# Patient Record
Sex: Female | Born: 1960 | Race: White | Hispanic: No | Marital: Married | Smoking: Never smoker
Health system: Southern US, Community
[De-identification: ages and names within clinical notes are randomized; demographics above are authoritative.]

## PROBLEM LIST (undated history)

## (undated) DIAGNOSIS — I10 Essential (primary) hypertension: Secondary | ICD-10-CM

## (undated) DIAGNOSIS — C801 Malignant (primary) neoplasm, unspecified: Secondary | ICD-10-CM

## (undated) HISTORY — DX: Malignant (primary) neoplasm, unspecified: C80.1

## (undated) HISTORY — PX: ABDOMINAL HYSTERECTOMY: SHX81

---

## 1994-05-26 DIAGNOSIS — Z8742 Personal history of other diseases of the female genital tract: Secondary | ICD-10-CM | POA: Insufficient documentation

## 1998-04-10 ENCOUNTER — Other Ambulatory Visit: Admission: RE | Admit: 1998-04-10 | Discharge: 1998-04-10 | Payer: Self-pay | Admitting: Gynecology

## 1999-12-26 ENCOUNTER — Other Ambulatory Visit: Admission: RE | Admit: 1999-12-26 | Discharge: 1999-12-26 | Payer: Self-pay | Admitting: Gynecology

## 2001-01-07 ENCOUNTER — Other Ambulatory Visit: Admission: RE | Admit: 2001-01-07 | Discharge: 2001-01-07 | Payer: Self-pay | Admitting: Gynecology

## 2001-06-07 ENCOUNTER — Encounter (INDEPENDENT_AMBULATORY_CARE_PROVIDER_SITE_OTHER): Payer: Self-pay | Admitting: Specialist

## 2001-06-07 ENCOUNTER — Observation Stay (HOSPITAL_COMMUNITY): Admission: RE | Admit: 2001-06-07 | Discharge: 2001-06-08 | Payer: Self-pay | Admitting: Gynecology

## 2004-08-15 ENCOUNTER — Other Ambulatory Visit: Admission: RE | Admit: 2004-08-15 | Discharge: 2004-08-15 | Payer: Self-pay | Admitting: Gynecology

## 2008-05-31 ENCOUNTER — Ambulatory Visit: Payer: Self-pay

## 2008-05-31 ENCOUNTER — Ambulatory Visit: Payer: Self-pay | Admitting: Cardiology

## 2008-05-31 LAB — CONVERTED CEMR LAB
BUN: 13 mg/dL (ref 6–23)
CO2: 28 meq/L (ref 19–32)
Calcium: 8.9 mg/dL (ref 8.4–10.5)
Chloride: 105 meq/L (ref 96–112)
Creatinine, Ser: 0.6 mg/dL (ref 0.4–1.2)
Glucose, Bld: 89 mg/dL (ref 70–99)

## 2009-04-03 ENCOUNTER — Encounter: Admission: RE | Admit: 2009-04-03 | Discharge: 2009-04-03 | Payer: Self-pay | Admitting: Gynecology

## 2010-03-26 ENCOUNTER — Encounter: Admission: RE | Admit: 2010-03-26 | Discharge: 2010-03-26 | Payer: Self-pay | Admitting: Gynecology

## 2010-06-16 ENCOUNTER — Encounter: Payer: Self-pay | Admitting: Gynecology

## 2010-09-16 ENCOUNTER — Other Ambulatory Visit: Payer: Self-pay | Admitting: Gynecology

## 2010-10-08 NOTE — Assessment & Plan Note (Signed)
[facility]                            CARDIOLOGY OFFICE NOTE   NAME:Rhonda Roberson, Rhonda Roberson                       MRN:          [id number]  DATE:05/31/2008                            DOB:          [birth year]    Rhonda Roberson is a pleasant 50 year old female who I am asked to evaluate  for chest pain, dizziness, and hypertension.  She has no prior cardiac  history.  She typically does not have dyspnea on exertion, orthopnea,  PND, pedal edema, palpitations, or syncope.  Over the past 2 months, she  has noticed occasional chest pain.  It is in the substernal area.  It is  described as a pressure.  It lasted for 1-2 minutes and resolved  spontaneously.  She notices this more with activity and it does not  occur at rest.  Note, it is not pleuritic in positional nor it is  related to food.  There is no associated nausea, vomiting, shortness  breath, or diaphoresis.  She has also had episodes of dizziness.  She  states she will suddenly become lightheaded.  This lasted for  approximately one minute and resolved spontaneously.  It is not  associated with palpitations, chest pain, nausea, vomiting, dysarthria,  or loss of strength and sensation in her extremities.  She also has been  diagnosed with hypertension.  She states her blood pressure systolic  runs in the 180-190 range at home.  She does state that she occasionally  feels headaches as well with a warm sensation prior to this.  Because of  the above, we were asked to further evaluate.   PRESENT MEDICATION:  Micardis HCT 80/12.5 mg p.o. daily.   ALLERGIES:  She has no known drug allergies.   SOCIAL HISTORY:  She does not smoke.  She occasionally consumes alcohol.  She is married, with 2 children.   FAMILY HISTORY:  Negative for coronary artery disease.  There is also no  hypertension in her family.   PAST MEDICAL HISTORY:  Significant for hypertension, but there is no  diabetes mellitus or hyperlipidemia.   She has a history of a partial  hysterectomy.   REVIEW OF SYSTEMS:  She denies any fevers or chills.  She has had  problems with headaches.  There is no productive cough or hemoptysis.  There is no dysphagia, odynophagia, melena, or hematochezia.  There is  no dysuria or hematuria.  There is no rash or seizure activity.  There  is no orthopnea, PND, or pedal edema.  She does have some pain in her  knees at times.  The remaining systems are negative.   PHYSICAL EXAMINATION:  VITAL SIGNS:  Today shows a blood pressure of  148/90 in the left arm and 144/88 in the right arm.  Her pulse is 69.  She weighs 233 pounds.  GENERAL:  She is well-developed and somewhat obese.  She is in no acute  distress at present.  SKIN:  Warm and dry.  She does not appeared to be depressed.  There is  no peripheral clubbing.  BACK:  Normal.  HEENT:  Normal  with normal eyelids.  NECK:  Supple with normal upstroke bilaterally.  No bruits heard.  There  is no jugular venous distention and there is no thyromegaly.  CHEST:  Clear to auscultation.  No expansion.  CARDIOVASCULAR:  Regular rate and rhythm.  Normal S1 and S2.  There are  no murmurs, rubs, or gallops noted.  There is no change of Valsalva.  ABDOMEN:  Nontender, distended, positive bowel sounds.  No  hepatosplenomegaly, no mass appreciated.  There is no abdominal bruit.  She has 2+ femoral pulses bilaterally.  No bruits.  EXTREMITIES:  No edema.  I palpate no cords.  She has 2+ dorsalis pedis  pulses bilaterally.  NEUROLOGIC:  Grossly intact.   Her electrocardiogram shows a sinus rhythm at a rate of 69.  The axis is  normal.  There are no ST changes noted.   DIAGNOSES:  1. Hypertension - The patient's blood pressure is elevated today.  We      will continue with her Micardis HCT.  I will also add Toprol 50 mg      p.o. daily.  We can adjust this as indicated.  I have asked her to      track her blood pressure at home and keep records for [place].  I  have      also asked her to bring her cuff in the next visit and we will      correlate with ours.  I have also explained lifestyle modification      to help lower blood pressure including low-sodium diet, exercise,      and weight loss.  2. Atypical chest pain - [name]'s symptoms are somewhat atypical.      I will schedule her to have stress echocardiogram to more fully      evaluate.  3. Dizzy spells - Etiology of this is unclear to me, but I am not      convinced this is cardiac related.  We will schedule her.  We will      have CardioNet monitor to make sure this is not having significant      arrhythmias.   I will see her back in approximately 4 weeks.  If her blood pressure  becomes difficult to control, then we will schedule her to have renal  Dopplers to exclude renal artery stenosis.  If she continues to have  problems with flushing sensation, then we can consider a  pheochromocytoma in the future, although I think it is unlikely.     Madolyn Frieze Jens Som, MD, [facility]  Electronically Signed    BSC/MedQ  DD: 05/31/2008  DT: 06/01/2008  Job #: [id number]   cc:   Roney Marion, M.D.

## 2010-10-11 NOTE — Op Note (Signed)
[facility]  Patient:    Rhonda Roberson, Rhonda Roberson Visit Number: [id number] MRN: [id number]          Service Type: GYN Location: [id number] Attending Physician:  Susa Raring Dictated by:   Luvenia Redden, M.D. Proc. Date: 06/07/00 Admit Date:  06/07/2001 Discharge Date: 06/08/2001                             Operative Report  PREOPERATIVE DIAGNOSES:       1. Uterine prolapse.                               2. Chronic pelvic pain.                               3. Dysmenorrhea.                               4. Dyspareunia.  POSTOPERATIVE DIAGNOSES:      1. Uterine prolapse.                               2. Chronic pelvic pain.                               3. Dysmenorrhea.                               4. Dyspareunia.  OPERATION:                    Total vaginal hysterectomy.  SURGEON:                      Luvenia Redden, M.D.  ASSISTANT:                    Caralyn Guile. Arlyce Dice, M.D.  DESCRIPTION OF PROCEDURE:     Under good anesthesia, the patient was prepped and draped in a sterile manner.  The bladder was catheterized.  The cervix was grasped with a tenaculum and paracervical tissues were infiltrated with Marcaine and epinephrine solution.  The cervix was circumcised and the mucosa was dissected away by sharp and blunt dissection.  The posterior cul-de-sac was entered sharply.  The bladder flap was developed on blunt dissection.  The uterosacral ligaments were clamped, cut, and ligated.  The cardinal ligaments were clamped, cut, and ligated in two bites.  The peritoneal cavity was entered anteriorly.  The broad ligaments including uterine vessels were then clamped, cut, and ligated in two bites.  The fundus was delivered from the utero-ovarian ligaments, fallopian tubes, and round ligaments.  On either side of the clamp and cut, these stumps were doubly ligated.  The stumps were inspected and there was no active bleeding.  The posterior cuff  was run with a locked continuous 2-0 Vicryl for hemostasis.  The peritoneum was then closed, and after all the counts were correct, with a pursestring suture of Monocryl. The vaginal mucosa was then closed with a locked continuous 2-0 Vicryl.  Blood loss was 100 cc.  None was replaced.  The patient  tolerated the procedure well.  A Foley catheter was placed in the bladder and there was clear urine coming from the bladder at the end of the procedure. Dictated by:   Luvenia Redden, M.D. Attending Physician:  Susa Raring DD:  07/07/01 TD:  07/08/01 Job: [id number]

## 2011-05-05 ENCOUNTER — Other Ambulatory Visit: Payer: Self-pay | Admitting: Orthopedic Surgery

## 2011-05-09 ENCOUNTER — Other Ambulatory Visit: Payer: Self-pay | Admitting: Orthopedic Surgery

## 2011-05-23 ENCOUNTER — Encounter (HOSPITAL_BASED_OUTPATIENT_CLINIC_OR_DEPARTMENT_OTHER)
Admission: RE | Disposition: A | Discharge status: Home / Self Care | Payer: Self-pay | Source: Ambulatory Visit | Attending: Orthopedic Surgery

## 2011-05-23 ENCOUNTER — Ambulatory Visit (HOSPITAL_BASED_OUTPATIENT_CLINIC_OR_DEPARTMENT_OTHER)
Admission: RE | Admit: 2011-05-23 | Discharge: 2011-05-23 | Disposition: A | Discharge status: Home / Self Care | Payer: BC Managed Care – PPO | Source: Ambulatory Visit | Attending: Orthopedic Surgery | Admitting: Orthopedic Surgery

## 2011-05-23 ENCOUNTER — Encounter (HOSPITAL_BASED_OUTPATIENT_CLINIC_OR_DEPARTMENT_OTHER): Payer: Self-pay | Admitting: Orthopedic Surgery

## 2011-05-23 DIAGNOSIS — M19049 Primary osteoarthritis, unspecified hand: Secondary | ICD-10-CM | POA: Insufficient documentation

## 2011-05-23 DIAGNOSIS — D1610 Benign neoplasm of short bones of unspecified upper limb: Secondary | ICD-10-CM | POA: Insufficient documentation

## 2011-05-23 DIAGNOSIS — M6740 Ganglion, unspecified site: Secondary | ICD-10-CM | POA: Insufficient documentation

## 2011-05-23 HISTORY — PX: MASS EXCISION: SHX2000

## 2011-05-23 SURGERY — MINOR EXCISION OF MASS
Anesthesia: LOCAL | Site: Finger | Laterality: Right | Wound class: Clean

## 2011-05-23 MED ORDER — 0.9 % SODIUM CHLORIDE (POUR BTL) OPTIME
TOPICAL | Status: DC | PRN
  Administered 2011-05-23: 20 mL

## 2011-05-23 MED ORDER — CHLORHEXIDINE GLUCONATE 4 % EX LIQD: 60.0000 mL | Freq: Once | CUTANEOUS | Status: DC

## 2011-05-23 MED ORDER — LIDOCAINE HCL 2 % IJ SOLN
INTRAMUSCULAR | Status: DC | PRN
  Administered 2011-05-23: 4 mL

## 2011-05-23 MED ORDER — DOXYCYCLINE HYCLATE 100 MG PO TABS: 100.0000 mg | ORAL_TABLET | Freq: Two times a day (BID) | ORAL | Status: AC

## 2011-05-23 MED ORDER — HYDROCODONE-ACETAMINOPHEN 5-325 MG PO TABS: ORAL_TABLET | ORAL | Status: AC

## 2011-05-23 SURGICAL SUPPLY — 39 items
BANDAGE ADHESIVE 1X3 (GAUZE/BANDAGES/DRESSINGS) IMPLANT
BLADE SURG 15 STRL LF DISP TIS (BLADE) ×1 IMPLANT
BLADE SURG 15 STRL SS (BLADE) ×2
BNDG CMPR 9X4 STRL LF SNTH (GAUZE/BANDAGES/DRESSINGS)
BNDG CMPR MD 5X2 ELC HKLP STRL (GAUZE/BANDAGES/DRESSINGS)
BNDG COHESIVE 1X5 TAN STRL LF (GAUZE/BANDAGES/DRESSINGS) IMPLANT
BNDG ELASTIC 2 VLCR STRL LF (GAUZE/BANDAGES/DRESSINGS) IMPLANT
BNDG ESMARK 4X9 LF (GAUZE/BANDAGES/DRESSINGS) IMPLANT
BRUSH SCRUB EZ PLAIN DRY (MISCELLANEOUS) ×2 IMPLANT
CLOTH BEACON ORANGE TIMEOUT ST (SAFETY) ×2 IMPLANT
CORDS BIPOLAR (ELECTRODE) IMPLANT
COVER MAYO STAND STRL (DRAPES) ×2 IMPLANT
CUFF TOURNIQUET SINGLE 18IN (TOURNIQUET CUFF) IMPLANT
DECANTER SPIKE VIAL GLASS SM (MISCELLANEOUS) IMPLANT
DRAPE SURG 17X23 STRL (DRAPES) ×2 IMPLANT
GAUZE SPONGE 4X4 12PLY STRL LF (GAUZE/BANDAGES/DRESSINGS) ×4 IMPLANT
GAUZE XEROFORM 1X8 LF (GAUZE/BANDAGES/DRESSINGS) IMPLANT
GLOVE BIO SURGEON STRL SZ 6.5 (GLOVE) ×1 IMPLANT
GLOVE BIOGEL M STRL SZ7.5 (GLOVE) ×2 IMPLANT
GLOVE INDICATOR 7.0 STRL GRN (GLOVE) ×2 IMPLANT
GLOVE ORTHO TXT STRL SZ7.5 (GLOVE) ×2 IMPLANT
GOWN PREVENTION PLUS XLARGE (GOWN DISPOSABLE) ×2 IMPLANT
NDL BLUNT 17GA (NEEDLE) IMPLANT
NEEDLE 27GAX1X1/2 (NEEDLE) IMPLANT
NEEDLE BLUNT 17GA (NEEDLE) ×2 IMPLANT
PACK BASIN DAY SURGERY FS (CUSTOM PROCEDURE TRAY) ×2 IMPLANT
PADDING CAST ABS 4INX4YD NS (CAST SUPPLIES) ×1
PADDING CAST ABS COTTON 4X4 ST (CAST SUPPLIES) ×1 IMPLANT
SPONGE GAUZE 4X4 12PLY (GAUZE/BANDAGES/DRESSINGS) ×2 IMPLANT
STOCKINETTE 4X48 STRL (DRAPES) ×2 IMPLANT
SUT ETHILON 5 0 P 3 18 (SUTURE) ×1
SUT NYLON ETHILON 5-0 P-3 1X18 (SUTURE) ×1 IMPLANT
SYR 20CC LL (SYRINGE) ×1 IMPLANT
SYR 3ML 23GX1 SAFETY (SYRINGE) IMPLANT
SYR CONTROL 10ML LL (SYRINGE) IMPLANT
TOWEL OR 17X24 6PK STRL BLUE (TOWEL DISPOSABLE) ×4 IMPLANT
TRAY DSU PREP LF (CUSTOM PROCEDURE TRAY) ×2 IMPLANT
UNDERPAD 30X30 INCONTINENT (UNDERPADS AND DIAPERS) ×2 IMPLANT
WATER STERILE IRR 1000ML POUR (IV SOLUTION) ×2 IMPLANT

## 2011-05-23 NOTE — H&P (Signed)
  May 05, 2011     Philemon Kingdom, MD FAX # [id number]  RE: Rhonda Roberson  DOB:   [birth year]   Dear Dr. Sudie Bailey:  Rhonda Roberson presents for evaluation of a large mucoid cyst on the dorsal aspect of her right ring finger.  This has been present for more than two weeks.  She has tried to drain spontaneously.  She saw a dermatologist who very appropriately recommended she see a hand surgeon for debridement of the cyst and her DIP joint.   Rhonda Roberson is well known to our practice.  I have cared for her husband and her in-laws.  She is 5'7" and does not share her weight.  She does not use any medication for this predicament.    Her past history reveals that she is allergic to penicillin.  Current medications are metoprolol 50 mg. daily, Micardis 80/12.5 daily.  Social history reveals that she is married, she is a nonsmoker, she enjoys a rare alcoholic beverage.  She works as a Interior and spatial designer for [employer].    Family history is detailed and positive for hypertension.    14-point review of systems reveals corrective lenses and high blood pressure.   Physical examination reveals a very well appearing 50 year-old woman.  Inspection of her hands reveals the aforementioned mucoid cyst measuring approximately 5 mm.  in diameter.  This has obviously been ruptured with needle.  She has Heberden's nodes consistent with osteoarthrosis.  There is no sign of deep infection. She has full range of motion of her fingers in flexion/extension.    Four views of her finger demonstrate soft tissue swelling at the site of her cyst. She has narrowing of the IP joint and marginal osteophyte formation.   ASSESSMENT:   Background osteoarthritis with mucoid cyst formation.   PLAN:  We told her at her convenience she can schedule debridement under local anesthesia.  This surgery and aftercare were described in detail. We cannot affect the natural history of her osteoarthritis, but should be able to prevent recurrence of her mucoid  cyst with proper intervention.    Questions regarding her predicament were invited and answered in detail.   As always thanks for the privilege of seeing your patients.   Best regards,    Rhonda Roberson, M.D., Montez Hageman.  RVS:cmf    T:  12.11.12                  H&P documentation: 05/23/2011  -History and Physical Reviewed  -Patient has been re-examined  -No change in the plan of care  Wyn Forster, MD

## 2011-05-23 NOTE — Brief Op Note (Signed)
05/23/2011  9:55 AM  PATIENT:  Rhonda Roberson  50 y.o. female  PRE-OPERATIVE DIAGNOSIS:  mucoid cyst dip joint right ring finger  POST-OPERATIVE DIAGNOSIS:  Mucoid cyst dip joint right ring finger  PROCEDURE:  Procedure(s):  EXCISION OF MASS (MUCOID CYST) RIGHT RING FINGER AND DIP JOINT DEBRIDEMENT  SURGEON:  Surgeon(s): Wyn Forster., MD  PHYSICIAN ASSISTANT:   ASSISTANTS: Mallory Shirk.A-C   ANESTHESIA:   local  EBL:     BLOOD ADMINISTERED:none  DRAINS: none   LOCAL MEDICATIONS USED:  XYLOCAINE 4 CC  SPECIMEN:  No Specimen  DISPOSITION OF SPECIMEN:  N/A  COUNTS:  YES  TOURNIQUET:  * Missing tourniquet times found for documented tourniquets in log:  [id number] *  DICTATION: .Other Dictation: Dictation Number [id number]  PLAN OF CARE: Discharge to home after PACU  PATIENT DISPOSITION:  PACU - hemodynamically stable.

## 2011-05-23 NOTE — Op Note (Signed)
Op note dictated: [id number] 05/23/11

## 2011-05-24 NOTE — Op Note (Signed)
NAME:  Rhonda Roberson NO.:  [id number]  MEDICAL RECORD NO.:  [id number]  LOCATION:                                 FACILITY:  PHYSICIAN:  Katy Fitch. Saveah Bahar, M.D.      DATE OF BIRTH:  DATE OF PROCEDURE:  05/23/2011 DATE OF DISCHARGE:                              OPERATIVE REPORT   PREOPERATIVE DIAGNOSIS:  Large mucoid cyst, dorsal radial aspect of right ring finger distal interphalangeal joint with episodes of prior rupture and x-ray documented degenerative arthritis of right ring finger distal interphalangeal joint with marginal osteophytes at the base of distal phalanx and middle phalangeal head.  POSTOPERATIVE DIAGNOSIS:  Large mucoid cyst, dorsal radial aspect of right ring finger distal interphalangeal joint with episodes of prior rupture and x-ray documented degenerative arthritis of right ring finger distal interphalangeal joint with marginal osteophytes at the base of distal phalanx and middle phalangeal head.  OPERATION: 1. Distal interphalangeal joint debridement with removal of marginal     osteophytes, and dorsal synovectomy. 2. Resection of subcutaneous mucoid cyst, dorsal radial aspect of     right index finger distal interphalangeal joint.  OPERATING SURGEON:  Katy Fitch. Hermelinda Diegel, MD  ASSISTANT:  Marveen Reeks Dasnoit, PA-C  ANESTHESIA:  Lidocaine 2% metacarpal head level block, right ring finger.  This was performed as a minor operating room procedure without sedation.  INDICATIONS:  Rhonda Roberson is a 50 year old who is employed in Set [employer].  She has had a history of mucoid cyst for months.  This has ruptured spontaneously on several occasions.  Fortunately, she has had no sign of septic arthritis.  She presented for evaluation and was advised of the predicament of mucoid cyst.  __________ finger documented marginal osteophytes and joint space narrowing.  We advised to proceed with debridement of the cyst and DIP joint debridement with  synovectomy.  She understands that we could not affect the natural history of her arthritis but should prevent rapid recurrence of a mucoid cyst with joint debridement.  Over time, she could develop other mucoid cysts in this finger or other digits.  Questions regarding the anticipated procedure were invited and answered in detail.  PROCEDURE:  Rhonda Roberson was brought to room 1 at the [facility] and placed in supine position upon the operating table. Following detailed informed consent, alcohol and Betadine prep,  she was injected with 4 mL of 2% lidocaine in the metacarpal head level to obtain a digital block.  After 5 minutes, excellent anesthesia was achieved.  The right hand and arm were then prepped with Betadine soap and solution and sterilely draped.  Following routine surgical time-out, the finger was exsanguinated with a gauze wrap and a 0.5 inch Penrose drain placed at the proximal phalangeal segment as a digital tourniquet.  Procedure commenced with curvilinear incision incorporating the base of the mucoid cyst.  Skin flap was elevated and the cyst debrided to the level of the dermis.  All mucin was removed.  There was noted to be a small sinus tract exiting through the extensor tendon.  This was curetted.  An arthrotomy was then created on the radial aspect of the joint with  resection of the capsule between the terminal extensor tendon slip and the radial collateral ligament.  A micro curette and micro rongeur was used to debride the marginal osteophytes at the base of the distal phalanx and along the radial aspect of the middle phalangeal head.  A complete synovectomy of the visible portions of the joint was accomplished.  The joint was then debrided and irrigated thoroughly with a 19-gauge dental needle and a 10 mL syringe with sterile saline.  The wound was then repaired with trauma sutures of 5-0 nylon.  A compressive dressing was applied with Xeroflo,  sterile gauze, and a wrist based Coban dressing.  There were no apparent complications.     Katy Fitch Rhonda Roberson, M.D.     RVS/MEDQ  D:  05/23/2011  T:  05/24/2011  Job:  [id number]

## 2011-05-26 ENCOUNTER — Encounter (HOSPITAL_BASED_OUTPATIENT_CLINIC_OR_DEPARTMENT_OTHER): Payer: Self-pay | Admitting: Orthopedic Surgery

## 2014-04-03 ENCOUNTER — Other Ambulatory Visit: Payer: Self-pay | Admitting: Gynecology

## 2014-04-04 LAB — CYTOLOGY - PAP

## 2014-05-26 DIAGNOSIS — N3941 Urge incontinence: Secondary | ICD-10-CM | POA: Insufficient documentation

## 2016-05-26 HISTORY — PX: COLONOSCOPY: SHX174

## 2017-08-18 DIAGNOSIS — M7989 Other specified soft tissue disorders: Secondary | ICD-10-CM | POA: Diagnosis not present

## 2017-08-18 DIAGNOSIS — Z79899 Other long term (current) drug therapy: Secondary | ICD-10-CM | POA: Diagnosis not present

## 2017-08-18 DIAGNOSIS — I10 Essential (primary) hypertension: Secondary | ICD-10-CM | POA: Diagnosis not present

## 2017-08-18 DIAGNOSIS — E6609 Other obesity due to excess calories: Secondary | ICD-10-CM | POA: Diagnosis not present

## 2017-09-23 DIAGNOSIS — Z1231 Encounter for screening mammogram for malignant neoplasm of breast: Secondary | ICD-10-CM | POA: Diagnosis not present

## 2017-09-23 DIAGNOSIS — Z6838 Body mass index (BMI) 38.0-38.9, adult: Secondary | ICD-10-CM | POA: Diagnosis not present

## 2017-09-23 DIAGNOSIS — Z01419 Encounter for gynecological examination (general) (routine) without abnormal findings: Secondary | ICD-10-CM | POA: Diagnosis not present

## 2017-10-19 DIAGNOSIS — L0390 Cellulitis, unspecified: Secondary | ICD-10-CM | POA: Diagnosis not present

## 2017-10-20 DIAGNOSIS — L255 Unspecified contact dermatitis due to plants, except food: Secondary | ICD-10-CM | POA: Diagnosis not present

## 2017-10-20 DIAGNOSIS — L0100 Impetigo, unspecified: Secondary | ICD-10-CM | POA: Diagnosis not present

## 2017-11-04 DIAGNOSIS — L255 Unspecified contact dermatitis due to plants, except food: Secondary | ICD-10-CM | POA: Diagnosis not present

## 2017-11-25 DIAGNOSIS — L255 Unspecified contact dermatitis due to plants, except food: Secondary | ICD-10-CM | POA: Diagnosis not present

## 2018-01-04 DIAGNOSIS — M19041 Primary osteoarthritis, right hand: Secondary | ICD-10-CM | POA: Diagnosis not present

## 2018-01-04 DIAGNOSIS — M6740 Ganglion, unspecified site: Secondary | ICD-10-CM | POA: Diagnosis not present

## 2018-01-05 ENCOUNTER — Other Ambulatory Visit: Payer: Self-pay | Admitting: Orthopedic Surgery

## 2018-01-19 ENCOUNTER — Encounter (HOSPITAL_BASED_OUTPATIENT_CLINIC_OR_DEPARTMENT_OTHER)
Admission: RE | Admit: 2018-01-19 | Discharge: 2018-01-19 | Disposition: A | Discharge status: Home / Self Care | Payer: BLUE CROSS/BLUE SHIELD | Source: Ambulatory Visit | Attending: Orthopedic Surgery | Admitting: Orthopedic Surgery

## 2018-01-19 ENCOUNTER — Other Ambulatory Visit: Payer: Self-pay

## 2018-01-19 ENCOUNTER — Encounter (HOSPITAL_BASED_OUTPATIENT_CLINIC_OR_DEPARTMENT_OTHER): Payer: Self-pay | Admitting: *Deleted

## 2018-01-19 DIAGNOSIS — Z01812 Encounter for preprocedural laboratory examination: Secondary | ICD-10-CM | POA: Insufficient documentation

## 2018-01-19 DIAGNOSIS — Z01818 Encounter for other preprocedural examination: Secondary | ICD-10-CM | POA: Insufficient documentation

## 2018-01-19 DIAGNOSIS — Z01810 Encounter for preprocedural cardiovascular examination: Secondary | ICD-10-CM | POA: Insufficient documentation

## 2018-01-19 LAB — BASIC METABOLIC PANEL
Anion gap: 9 (ref 5–15)
BUN: 9 mg/dL (ref 6–20)
CO2: 29 mmol/L (ref 22–32)
CREATININE: 0.63 mg/dL (ref 0.44–1.00)
Calcium: 8.9 mg/dL (ref 8.9–10.3)
Chloride: 105 mmol/L (ref 98–111)
GFR calc Af Amer: >60 mL/min (ref >60)
GFR calc non Af Amer: >60 mL/min (ref >60)
GLUCOSE: 84 mg/dL (ref 70–99)
Potassium: 3.6 mmol/L (ref 3.5–5.1)
Sodium: 143 mmol/L (ref 135–145)

## 2018-01-26 ENCOUNTER — Ambulatory Visit (HOSPITAL_BASED_OUTPATIENT_CLINIC_OR_DEPARTMENT_OTHER): Payer: BLUE CROSS/BLUE SHIELD | Admitting: Certified Registered"

## 2018-01-26 ENCOUNTER — Encounter (HOSPITAL_BASED_OUTPATIENT_CLINIC_OR_DEPARTMENT_OTHER): Payer: Self-pay | Admitting: *Deleted

## 2018-01-26 ENCOUNTER — Ambulatory Visit (HOSPITAL_BASED_OUTPATIENT_CLINIC_OR_DEPARTMENT_OTHER)
Admission: RE | Admit: 2018-01-26 | Discharge: 2018-01-26 | Disposition: A | Discharge status: Home / Self Care | Payer: BLUE CROSS/BLUE SHIELD | Source: Ambulatory Visit | Attending: Orthopedic Surgery | Admitting: Orthopedic Surgery

## 2018-01-26 ENCOUNTER — Other Ambulatory Visit: Payer: Self-pay

## 2018-01-26 ENCOUNTER — Encounter (HOSPITAL_BASED_OUTPATIENT_CLINIC_OR_DEPARTMENT_OTHER)
Admission: RE | Disposition: A | Discharge status: Home / Self Care | Payer: Self-pay | Source: Ambulatory Visit | Attending: Orthopedic Surgery

## 2018-01-26 DIAGNOSIS — R2231 Localized swelling, mass and lump, right upper limb: Secondary | ICD-10-CM | POA: Diagnosis not present

## 2018-01-26 DIAGNOSIS — Z79899 Other long term (current) drug therapy: Secondary | ICD-10-CM | POA: Insufficient documentation

## 2018-01-26 DIAGNOSIS — I10 Essential (primary) hypertension: Secondary | ICD-10-CM | POA: Diagnosis not present

## 2018-01-26 DIAGNOSIS — M25841 Other specified joint disorders, right hand: Secondary | ICD-10-CM | POA: Insufficient documentation

## 2018-01-26 DIAGNOSIS — M151 Heberden's nodes (with arthropathy): Secondary | ICD-10-CM | POA: Insufficient documentation

## 2018-01-26 DIAGNOSIS — M19041 Primary osteoarthritis, right hand: Secondary | ICD-10-CM | POA: Diagnosis not present

## 2018-01-26 DIAGNOSIS — M85621 Other cyst of bone, right upper arm: Secondary | ICD-10-CM | POA: Diagnosis not present

## 2018-01-26 DIAGNOSIS — M71341 Other bursal cyst, right hand: Secondary | ICD-10-CM | POA: Diagnosis not present

## 2018-01-26 HISTORY — DX: Essential (primary) hypertension: I10

## 2018-01-26 HISTORY — PX: MASS EXCISION: SHX2000

## 2018-01-26 HISTORY — PX: ROTATION FLAP: SHX6211

## 2018-01-26 SURGERY — EXCISION MASS
Anesthesia: Regional | Site: Finger | Laterality: Right

## 2018-01-26 MED ORDER — VANCOMYCIN HCL IN DEXTROSE 1-5 GM/200ML-% IV SOLN
INTRAVENOUS | Status: AC
  Filled 2018-01-26: qty 200

## 2018-01-26 MED ORDER — BUPIVACAINE HCL (PF) 0.25 % IJ SOLN
INTRAMUSCULAR | Status: DC | PRN
  Administered 2018-01-26: 9 mL

## 2018-01-26 MED ORDER — FENTANYL CITRATE (PF) 100 MCG/2ML IJ SOLN
50.0000 ug | INTRAMUSCULAR | Status: DC | PRN
  Administered 2018-01-26: 100 ug via INTRAVENOUS

## 2018-01-26 MED ORDER — CHLORHEXIDINE GLUCONATE 4 % EX LIQD: 60.0000 mL | Freq: Once | CUTANEOUS | Status: DC

## 2018-01-26 MED ORDER — VANCOMYCIN HCL 10 G IV SOLR
1500.0000 mg | Freq: Once | INTRAVENOUS | Status: AC
  Administered 2018-01-26: 1500 mg via INTRAVENOUS

## 2018-01-26 MED ORDER — PROMETHAZINE HCL 25 MG/ML IJ SOLN: 6.2500 mg | INTRAMUSCULAR | Status: DC | PRN

## 2018-01-26 MED ORDER — MIDAZOLAM HCL 2 MG/2ML IJ SOLN
1.0000 mg | INTRAMUSCULAR | Status: DC | PRN
  Administered 2018-01-26: 2 mg via INTRAVENOUS

## 2018-01-26 MED ORDER — ONDANSETRON HCL 4 MG/2ML IJ SOLN
INTRAMUSCULAR | Status: DC | PRN
  Administered 2018-01-26: 4 mg via INTRAVENOUS

## 2018-01-26 MED ORDER — LACTATED RINGERS IV SOLN
INTRAVENOUS | Status: DC
  Administered 2018-01-26: 08:00:00 via INTRAVENOUS

## 2018-01-26 MED ORDER — LIDOCAINE HCL (CARDIAC) PF 100 MG/5ML IV SOSY
PREFILLED_SYRINGE | INTRAVENOUS | Status: DC | PRN
  Administered 2018-01-26: 30 mg via INTRAVENOUS

## 2018-01-26 MED ORDER — HYDROMORPHONE HCL 1 MG/ML IJ SOLN: 0.2500 mg | INTRAMUSCULAR | Status: DC | PRN

## 2018-01-26 MED ORDER — MIDAZOLAM HCL 2 MG/2ML IJ SOLN
INTRAMUSCULAR | Status: AC
  Filled 2018-01-26: qty 2

## 2018-01-26 MED ORDER — OXYCODONE HCL 5 MG PO TABS: 5.0000 mg | ORAL_TABLET | Freq: Once | ORAL | Status: DC | PRN

## 2018-01-26 MED ORDER — PROPOFOL 500 MG/50ML IV EMUL
INTRAVENOUS | Status: DC | PRN
  Administered 2018-01-26: 75 ug/kg/min via INTRAVENOUS

## 2018-01-26 MED ORDER — HYDROCODONE-ACETAMINOPHEN 5-325 MG PO TABS: 1.0000 | ORAL_TABLET | Freq: Four times a day (QID) | ORAL | 0 refills | Status: DC | PRN

## 2018-01-26 MED ORDER — OXYCODONE HCL 5 MG/5ML PO SOLN: 5.0000 mg | Freq: Once | ORAL | Status: DC | PRN

## 2018-01-26 MED ORDER — FENTANYL CITRATE (PF) 100 MCG/2ML IJ SOLN
INTRAMUSCULAR | Status: AC
  Filled 2018-01-26: qty 2

## 2018-01-26 MED ORDER — SCOPOLAMINE 1 MG/3DAYS TD PT72: 1.0000 | MEDICATED_PATCH | Freq: Once | TRANSDERMAL | Status: DC | PRN

## 2018-01-26 MED ORDER — BUPIVACAINE HCL (PF) 0.25 % IJ SOLN
INTRAMUSCULAR | Status: AC
  Filled 2018-01-26: qty 90

## 2018-01-26 MED ORDER — VANCOMYCIN HCL IN DEXTROSE 1-5 GM/200ML-% IV SOLN: 1000.0000 mg | INTRAVENOUS | Status: DC

## 2018-01-26 SURGICAL SUPPLY — 53 items
BANDAGE COBAN STERILE 2 (GAUZE/BANDAGES/DRESSINGS) IMPLANT
BLADE MINI RND TIP GREEN BEAV (BLADE) ×3 IMPLANT
BLADE SURG 15 STRL LF DISP TIS (BLADE) ×2 IMPLANT
BLADE SURG 15 STRL SS (BLADE) ×4
BNDG CMPR 9X4 STRL LF SNTH (GAUZE/BANDAGES/DRESSINGS) ×2
BNDG COHESIVE 1X5 TAN STRL LF (GAUZE/BANDAGES/DRESSINGS) ×3 IMPLANT
BNDG COHESIVE 3X5 TAN STRL LF (GAUZE/BANDAGES/DRESSINGS) ×1 IMPLANT
BNDG ESMARK 4X9 LF (GAUZE/BANDAGES/DRESSINGS) ×3 IMPLANT
BNDG GAUZE ELAST 4 BULKY (GAUZE/BANDAGES/DRESSINGS) ×4 IMPLANT
CHLORAPREP W/TINT 26ML (MISCELLANEOUS) ×4 IMPLANT
CORD BIPOLAR FORCEPS 12FT (ELECTRODE) ×4 IMPLANT
COVER BACK TABLE 60X90IN (DRAPES) ×4 IMPLANT
COVER MAYO STAND STRL (DRAPES) ×4 IMPLANT
CUFF TOURNIQUET SINGLE 18IN (TOURNIQUET CUFF) ×4 IMPLANT
DECANTER SPIKE VIAL GLASS SM (MISCELLANEOUS) IMPLANT
DRAIN PENROSE 1/2X12 LTX STRL (WOUND CARE) IMPLANT
DRAPE EXTREMITY T 121X128X90 (DRAPE) ×4 IMPLANT
DRAPE SURG 17X23 STRL (DRAPES) ×4 IMPLANT
GAUZE SPONGE 4X4 12PLY STRL (GAUZE/BANDAGES/DRESSINGS) ×4 IMPLANT
GAUZE XEROFORM 1X8 LF (GAUZE/BANDAGES/DRESSINGS) ×4 IMPLANT
GLOVE BIOGEL PI IND STRL 7.0 (GLOVE) ×2 IMPLANT
GLOVE BIOGEL PI IND STRL 8.5 (GLOVE) ×2 IMPLANT
GLOVE BIOGEL PI INDICATOR 7.0 (GLOVE) ×4
GLOVE BIOGEL PI INDICATOR 8.5 (GLOVE) ×2
GLOVE SURG ORTHO 8.0 STRL STRW (GLOVE) ×4 IMPLANT
GLOVE SURG SYN 7.5  E (GLOVE) ×2
GLOVE SURG SYN 7.5 E (GLOVE) ×2 IMPLANT
GLOVE SURG SYN 7.5 PF PI (GLOVE) IMPLANT
GOWN STRL REUS W/ TWL LRG LVL3 (GOWN DISPOSABLE) ×1 IMPLANT
GOWN STRL REUS W/ TWL XL LVL3 (GOWN DISPOSABLE) ×1 IMPLANT
GOWN STRL REUS W/TWL LRG LVL3 (GOWN DISPOSABLE)
GOWN STRL REUS W/TWL XL LVL3 (GOWN DISPOSABLE) ×8 IMPLANT
NDL PRECISIONGLIDE 27X1.5 (NEEDLE) ×1 IMPLANT
NDL SAFETY ECLIPSE 18X1.5 (NEEDLE) IMPLANT
NEEDLE HYPO 18GX1.5 SHARP (NEEDLE)
NEEDLE PRECISIONGLIDE 27X1.5 (NEEDLE) ×4 IMPLANT
NS IRRIG 1000ML POUR BTL (IV SOLUTION) ×4 IMPLANT
PACK BASIN DAY SURGERY FS (CUSTOM PROCEDURE TRAY) ×4 IMPLANT
PAD CAST 3X4 CTTN HI CHSV (CAST SUPPLIES) ×1 IMPLANT
PADDING CAST ABS 4INX4YD NS (CAST SUPPLIES)
PADDING CAST ABS COTTON 4X4 ST (CAST SUPPLIES) ×1 IMPLANT
PADDING CAST COTTON 3X4 STRL (CAST SUPPLIES)
SPLINT FINGER 4.25 BULB 911906 (SOFTGOODS) ×3 IMPLANT
SPLINT PLASTER CAST XFAST 3X15 (CAST SUPPLIES) IMPLANT
SPLINT PLASTER XTRA FASTSET 3X (CAST SUPPLIES)
STOCKINETTE 4X48 STRL (DRAPES) ×4 IMPLANT
SUT ETHILON 4 0 PS 2 18 (SUTURE) ×4 IMPLANT
SUT VIC AB 4-0 P2 18 (SUTURE) IMPLANT
SUT VICRYL 4-0 PS2 18IN ABS (SUTURE) IMPLANT
SYR BULB 3OZ (MISCELLANEOUS) ×4 IMPLANT
SYR CONTROL 10ML LL (SYRINGE) ×4 IMPLANT
TOWEL GREEN STERILE FF (TOWEL DISPOSABLE) ×4 IMPLANT
UNDERPAD 30X30 (UNDERPADS AND DIAPERS) ×1 IMPLANT

## 2018-01-26 NOTE — Anesthesia Procedure Notes (Signed)
Anesthesia Regional Block: Bier block (IV Regional)   Pre-Anesthetic Checklist: ,, timeout performed, Correct Patient, Correct Site, Correct Laterality, Correct Procedure,, site marked, surgical consent,, at surgeon's request  Laterality: Right     Needles:  Injection technique: Single-shot  Needle Type: Other      Needle Gauge: 22     Additional Needles:   Procedures:,,,,, intact distal pulses, Esmarch exsanguination, single tourniquet utilized,  Narrative:   Performed by: Personally       

## 2018-01-26 NOTE — Anesthesia Postprocedure Evaluation (Signed)
Anesthesia Post Note  Patient: Rhonda Roberson  Procedure(s) Performed: EXCISION MASS (Right Finger) ROTATION FLAP (Right Finger)     Patient location during evaluation: PACU Anesthesia Type: Bier Block Level of consciousness: awake and alert Pain management: pain level controlled Vital Signs Assessment: post-procedure vital signs reviewed and stable Respiratory status: spontaneous breathing, nonlabored ventilation and respiratory function stable Cardiovascular status: blood pressure returned to baseline and stable Postop Assessment: no apparent nausea or vomiting Anesthetic complications: no    Last Vitals:  Vitals:   01/26/18 0930 01/26/18 0943  BP: 128/70 134/89  Pulse: (!) 59 (!) 57  Resp: 14 16  Temp:  36.6 C  SpO2: 97% 100%    Last Pain:  Vitals:   01/26/18 0943  TempSrc:   PainSc: 0-No pain                 Lynda Rainwater

## 2018-01-26 NOTE — Brief Op Note (Signed)
01/26/2018  9:08 AM  PATIENT:  Rhonda Roberson  57 y.o. female  PRE-OPERATIVE DIAGNOSIS:  MUCOID CYST OF RIGHT INDEX FINGER-DEGENERATIVE JOINT DISEASE  POST-OPERATIVE DIAGNOSIS:  MUCOID CYST OF RIGHT INDEX FINGER-DEGENERATIVE JOINT DISEASE  PROCEDURE:  Procedure(s): EXCISION MASS (Right) ROTATION FLAP (Right)  SURGEON:  Surgeon(s) and Role:    Daryll Brod, MD - Primary  PHYSICIAN ASSISTANT:   ASSISTANTS: none   ANESTHESIA:   local, regional and IV sedation  EBL: 40ml BLOOD ADMINISTERED:none  DRAINS: none   LOCAL MEDICATIONS USED:  BUPIVICAINE   SPECIMEN:  Excision  DISPOSITION OF SPECIMEN:  PATHOLOGY  COUNTS:  YES  TOURNIQUET:   Total Tourniquet Time Documented: Forearm (Right) - 30 minutes Total: Forearm (Right) - 30 minutes   DICTATION: .Dragon Dictation  PLAN OF CARE: Discharge to home after PACU  PATIENT DISPOSITION:  PACU - hemodynamically stable.

## 2018-01-26 NOTE — Op Note (Signed)
NAME: EKAM BONEBRAKE MEDICAL RECORD NO: [id number] DATE OF BIRTH: [birth year] FAC[facility] LOCATION: [facility] PHYSICIAN: Wynonia Sours, MD   OPERATIVE REPORT   DATE OF PROCEDURE: 01/26/18    PREOPERATIVE DIAGNOSIS:   Degenerative arthritis mucoid cyst right index finger   POSTOPERATIVE DIAGNOSIS:   Same   PROCEDURE:  Excision mucoid cyst debridement of distal interphalangeal joint osteophytes middle phalanx with rotation dorsal flap   SURGEON: Daryll Brod, M.D.   ASSISTANT: none   ANESTHESIA:  Bier block with sedation and Local   INTRAVENOUS FLUIDS:  Per anesthesia flow sheet.   ESTIMATED BLOOD LOSS:  Minimal.   COMPLICATIONS:  None.   SPECIMENS:   Synovectomy cyst osteophytes   TOURNIQUET TIME:    Total Tourniquet Time Documented: Forearm (Right) - 30 minutes Total: Forearm (Right) - 30 minutes    DISPOSITION:  Stable to PACU.   INDICATIONS: Patient is a 57 year old female with a large mass on the dorsal aspect distal interphalangeal joint right index finger.  The skin is translucent.  The cyst measures approximately a centimeter in diameter.  She is elected to have this surgically excised with rotational flap debridement of the joint.  She is aware that there is no guarantee to the surgery the possibility of infection recurrence injury to arteries nerves tendons complete relief symptoms.  Is aware of the possibility of recurrence due to the arthritis being present in the joint underneath without fusion of the joint cyst can recur in the preoperative area the patient is seen extremity marked by both patient and surgeon antibiotic given  OPERATIVE COURSE: Patient brought to the operating room where a forearm-based IV regional anesthetic was carried out without difficulty.  And a forearm-based IV regional anesthetic was carried out under the direction the anesthesia department.  She was prepped using ChloraPrep and a three-minute dry time allowed.  A  timeout was taken to confirm patient procedure.  A metacarpal block was given with 1/4% bupivacaine without epinephrine to the base of the index finger right hand.  A curvilinear incision was made the cyst was immediately encountered distally.  This drained a clear mucinous fluid.  The incision was carried along the mid lateral line and then across the distal portion of the proximal interphalangeal joint.  The flap was then elevated.  Care was taken to protect vascular neural structures proximally.  The cyst was then excised with blunt sharp dissection.  A incision was made in the distal phalangeal joint with small rondure hemostatic in nature was used to remove dorsal synovitis and dorsal osteophytes on the middle phalanx.  Specimen was sent to pathology.  The translucent skin was excised.  The flap was then elevated incision made approximately allowing the flap to be rotated distally covering the entire area of translucent skin.  The wound was copiously irrigated with saline.  The skin was then sutured with interrupted 4-0 nylon sutures.  A sterile compressive dressing splint to the finger applied.  Inflation of the tourniquet remaining fingers pink.  She was taken to the recovery room for observation in satisfactory condition.  She will be discharged home to return Kennith Gain agrees were in 1 week on ibuprofen Tylenol with Norco as a back-up.   Wynonia Sours, MD Electronically signed, 01/26/18

## 2018-01-26 NOTE — Transfer of Care (Signed)
Immediate Anesthesia Transfer of Care Note  Patient: Rhonda Roberson  Procedure(s) Performed: EXCISION MASS (Right Finger) ROTATION FLAP (Right Finger)  Patient Location: PACU  Anesthesia Type:Bier block  Level of Consciousness: awake, alert , oriented and patient cooperative  Airway & Oxygen Therapy: Patient Spontanous Breathing and Patient connected to face mask oxygen  Post-op Assessment: Report given to RN and Post -op Vital signs reviewed and stable  Post vital signs: Reviewed and stable  Last Vitals:  Vitals Value Taken Time  BP    Temp    Pulse    Resp    SpO2      Last Pain:  Vitals:   01/26/18 0749  TempSrc: Oral         Complications: No apparent anesthesia complications

## 2018-01-26 NOTE — H&P (Signed)
  Rhonda Roberson is an 57 y.o. female.   Chief Complaint: mass right index HPI: Rhonda Roberson is a 57 year old right-hand-dominant female comes in with a complaint of a mass in the dorsal aspect right index finger which is been present for many months. She had this drained in November. It came right back. She was told that if it did this would have to be excised. She has no history of injury. She is complaining of a throbbing type pain with a VAS score 2/10. There is no history of injury to the finger. States nothing makes it better or worse. Has had no other treatment for it. She has no history of diabetes thyroid problems arthritis gout. Family history is positive diabetes thyroid problems and gout. He is negative for arthritis. She has been tested.   Past Medical History:  Diagnosis Date  . Hypertension     Past Surgical History:  Procedure Laterality Date  . ABDOMINAL HYSTERECTOMY     partial  . MASS EXCISION  05/23/2011   Procedure: MINOR EXCISION OF MASS;  Surgeon: Cammie Sickle., MD;  Location: [facility];  Service: Orthopedics;  Laterality: Right;  excision mucoid cyst with DIP joint arthrotomy right ring finger    Family History  Problem Relation Age of Onset  . Hypertension Mother   . Hypertension Father    Social History:  reports that she has never smoked. She has never used smokeless tobacco. She reports that she drinks alcohol. She reports that she does not use drugs.  Allergies:  Allergies  Allergen Reactions  . Penicillins Nausea And Vomiting    No medications prior to admission.    No results found for this or any previous visit (from the past 48 hour(s)).  No results found.   Pertinent items are noted in HPI.  Height 5\' 7"  (1.702 m), weight 105.2 kg.  General appearance: alert, cooperative and appears stated age Head: Normocephalic, without obvious abnormality Neck: no JVD Resp: clear to auscultation bilaterally Cardio: regular rate and  rhythm, S1, S2 normal, no murmur, click, rub or gallop GI: soft, non-tender; bowel sounds normal; no masses,  no organomegaly Extremities: mass right index Pulses: 2+ and symmetric Skin: Skin color, texture, turgor normal. No rashes or lesions Neurologic: Grossly normal Incision/Wound: na  Assessment/Plan Assessment:  1. Mucoid cyst of joint  Right (index) 2. Osteoarthritis of finger of right hand    Plan: We have discussed the nature of the problem with her with mucoid cyst degenerative arthritis distal interphalangeal joint. I would recommend surgical excision with rotational flap and debridement of the distal interphalangeal joint pre-peri-and postoperative course been discussed along with risks and complications. She is aware there is no guarantee to the surgery the possibility of infection recurrence injury to arteries nerves tendons complete relief symptoms dystrophy. Advised potential for loss of flap infection. Recurrence of the cyst were necessitating further treatment. Would like to proceed this was scheduled for an outpatient under regional anesthesia. This for right index finger   Rhonda Roberson R 01/26/2018, 5:23 AM

## 2018-01-26 NOTE — Discharge Instructions (Addendum)

## 2018-01-26 NOTE — Anesthesia Preprocedure Evaluation (Signed)
Anesthesia Evaluation  Patient identified by MRN, date of birth, ID band Patient awake    Reviewed: Allergy & Precautions, NPO status , Patient's Chart, lab work & pertinent test results  Airway Mallampati: II  TM Distance: >3 FB Neck ROM: Full    Dental no notable dental hx.    Pulmonary neg pulmonary ROS,    Pulmonary exam normal breath sounds clear to auscultation       Cardiovascular hypertension, Pt. on medications negative cardio ROS Normal cardiovascular exam Rhythm:Regular Rate:Normal     Neuro/Psych negative neurological ROS  negative psych ROS   GI/Hepatic negative GI ROS, Neg liver ROS,   Endo/Other  negative endocrine ROS  Renal/GU negative Renal ROS  negative genitourinary   Musculoskeletal negative musculoskeletal ROS (+)   Abdominal (+) + obese,   Peds negative pediatric ROS (+)  Hematology negative hematology ROS (+)   Anesthesia Other Findings   Reproductive/Obstetrics negative OB ROS                             Anesthesia Physical Anesthesia Plan  ASA: II  Anesthesia Plan: Bier Block and Bier Block-LIDOCAINE ONLY   Post-op Pain Management:    Induction: Intravenous  PONV Risk Score and Plan: 2 and Ondansetron and Midazolam  Airway Management Planned: Simple Face Mask  Additional Equipment:   Intra-op Plan:   Post-operative Plan:   Informed Consent: I have reviewed the patients History and Physical, chart, labs and discussed the procedure including the risks, benefits and alternatives for the proposed anesthesia with the patient or authorized representative who has indicated his/her understanding and acceptance.   Dental advisory given  Plan Discussed with: CRNA  Anesthesia Plan Comments:         Anesthesia Quick Evaluation

## 2018-01-27 ENCOUNTER — Encounter (HOSPITAL_BASED_OUTPATIENT_CLINIC_OR_DEPARTMENT_OTHER): Payer: Self-pay | Admitting: Orthopedic Surgery

## 2018-04-06 DIAGNOSIS — R05 Cough: Secondary | ICD-10-CM | POA: Diagnosis not present

## 2018-04-06 DIAGNOSIS — Z6839 Body mass index (BMI) 39.0-39.9, adult: Secondary | ICD-10-CM | POA: Diagnosis not present

## 2018-04-06 DIAGNOSIS — J0100 Acute maxillary sinusitis, unspecified: Secondary | ICD-10-CM | POA: Diagnosis not present

## 2018-04-13 DIAGNOSIS — Z79899 Other long term (current) drug therapy: Secondary | ICD-10-CM | POA: Diagnosis not present

## 2018-04-13 DIAGNOSIS — R05 Cough: Secondary | ICD-10-CM | POA: Diagnosis not present

## 2018-04-13 DIAGNOSIS — R5383 Other fatigue: Secondary | ICD-10-CM | POA: Diagnosis not present

## 2018-04-13 DIAGNOSIS — I10 Essential (primary) hypertension: Secondary | ICD-10-CM | POA: Diagnosis not present

## 2018-04-14 ENCOUNTER — Ambulatory Visit (INDEPENDENT_AMBULATORY_CARE_PROVIDER_SITE_OTHER): Payer: BLUE CROSS/BLUE SHIELD | Admitting: Allergy and Immunology

## 2018-04-14 ENCOUNTER — Encounter: Payer: Self-pay | Admitting: Allergy and Immunology

## 2018-04-14 VITALS — BP 162/92 | HR 84 | Temp 97.4°F | Resp 20 | Ht 67.0 in | Wt 247.0 lb

## 2018-04-14 DIAGNOSIS — T7800XD Anaphylactic reaction due to unspecified food, subsequent encounter: Secondary | ICD-10-CM

## 2018-04-14 NOTE — Progress Notes (Signed)
Dear Dr. Laqueta Due,  Thank you for referring Rhonda Roberson to the [facility] on 04/14/2018.   Below is a summation of this patient's evaluation and recommendations.  Thank you for your referral. I will keep you informed about this patient's response to treatment.   If you have any questions please do not hesitate to contact me.   Sincerely,  Jiles Prows, MD Allergy / Immunology [facility]   ______________________________________________________________________    NEW PATIENT NOTE  Referring Provider: Ernestene Kiel, MD Primary Provider: Ernestene Kiel, MD Date of office visit: 04/14/2018    Subjective:   Chief Complaint:  Rhonda Roberson (DOB: 02-Aug-1960) is a 57 y.o. female who presents to the clinic on 04/14/2018 with a chief complaint of Allergy Testing (possible shellfish allergy) .     HPI: Dorathy presents to this clinic in evaluation of possible shrimp allergy.  Approximately 15 years ago she ate shrimp and developed swollen areas in her mouth and on her tongue and felt as though her throat was tight and thickened and she took Benadryl and over the course of 24 hours this reaction resolved.  All of this started within 1 hour of consuming shrimp.  She has been shrimp free since that point in time.  She does eat lobster and crab and oysters with no problem.  She does not really have an atopic phenotype to speak of other than developing hives after using penicillin.    Past Medical History:  Diagnosis Date  . Hypertension     Past Surgical History:  Procedure Laterality Date  . ABDOMINAL HYSTERECTOMY     partial  . MASS EXCISION  05/23/2011   Procedure: MINOR EXCISION OF MASS;  Surgeon: Cammie Sickle., MD;  Location: [facility];  Service: Orthopedics;  Laterality: Right;  excision mucoid cyst with DIP joint arthrotomy right ring finger  . MASS  EXCISION Right 01/26/2018   Procedure: EXCISION MASS;  Surgeon: Daryll Brod, MD;  Location: [facility];  Service: Orthopedics;  Laterality: Right;  . ROTATION FLAP Right 01/26/2018   Procedure: ROTATION FLAP;  Surgeon: Daryll Brod, MD;  Location: [facility];  Service: Orthopedics;  Laterality: Right;    Allergies as of 04/14/2018      Reactions   Penicillins Rash      Medication List      azithromycin 250 MG tablet Commonly known as:  ZITHROMAX   furosemide 40 MG tablet Commonly known as:  LASIX TAKE 1 TABLET EVERY DAY IN THE MORNING AND 1/2 TABLET IN THE AFTERNOON AS NEEDED FOR SWELLING   predniSONE 20 MG tablet Commonly known as:  DELTASONE   valsartan 40 MG tablet Commonly known as:  DIOVAN Take 1 tablet by mouth daily.       Review of systems negative except as noted in HPI / PMHx or noted below:  Review of Systems  Constitutional: Negative.   HENT: Negative.   Eyes: Negative.   Respiratory: Negative.   Cardiovascular: Negative.   Gastrointestinal: Negative.   Genitourinary: Negative.   Musculoskeletal: Negative.   Skin: Negative.   Neurological: Negative.   Endo/Heme/Allergies: Negative.   Psychiatric/Behavioral: Negative.     Family History  Problem Relation Age of Onset  . Hypertension Mother   . Cancer - Colon Mother   . Breast cancer Mother   . Liver cancer Mother   . Hypertension  Father   . Diabetes Paternal Grandfather     Social History   Socioeconomic History  . Marital status: Married    Spouse name: Not on file  . Number of children: Not on file  . Years of education: Not on file  . Highest education level: Not on file  Occupational History  . Not on file  Social Needs  . Financial resource strain: Not on file  . Food insecurity:    Worry: Not on file    Inability: Not on file  . Transportation needs:    Medical: Not on file    Non-medical: Not on file  Tobacco Use  . Smoking status: Never Smoker  .  Smokeless tobacco: Never Used  Substance and Sexual Activity  . Alcohol use: Yes    Comment: social  . Drug use: Never  . Sexual activity: Not on file  Lifestyle  . Physical activity:    Days per week: Not on file    Minutes per session: Not on file  . Stress: Not on file  Relationships  . Social connections:    Talks on phone: Not on file    Gets together: Not on file    Attends religious service: Not on file    Active member of club or organization: Not on file    Attends meetings of clubs or organizations: Not on file    Relationship status: Not on file  . Intimate partner violence:    Fear of current or ex partner: Not on file    Emotionally abused: Not on file    Physically abused: Not on file    Forced sexual activity: Not on file  Other Topics Concern  . Not on file  Social History Narrative  . Not on file    Environmental and Social history  Lives in a house with a dry environment, no animals located inside the household, carpet in the bedroom, lasted on the bed, no plastic on the pillow, no smokers located inside the household.  Objective:   Vitals:   04/14/18 1433  BP: (!) 162/92  Pulse: 84  Resp: 20  Temp: (!) 97.4 F (36.3 C)   Height: 5\' 7"  (170.2 cm) Weight: 247 lb (112 kg)  Physical Exam  HENT:  Head: Normocephalic.  Right Ear: Tympanic membrane, external ear and ear canal normal.  Left Ear: Tympanic membrane, external ear and ear canal normal.  Nose: Nose normal. No mucosal edema or rhinorrhea.  Mouth/Throat: Uvula is midline, oropharynx is clear and moist and mucous membranes are normal. No oropharyngeal exudate.  Eyes: Conjunctivae are normal.  Neck: Trachea normal. No tracheal tenderness present. No tracheal deviation present. No thyromegaly present.  Cardiovascular: Normal rate, regular rhythm, S1 normal, S2 normal and normal heart sounds.  No murmur heard. Pulmonary/Chest: Breath sounds normal. No stridor. No respiratory distress. She has  no wheezes. She has no rales.  Musculoskeletal: She exhibits no edema.  Lymphadenopathy:       Head (right side): No tonsillar adenopathy present.       Head (left side): No tonsillar adenopathy present.    She has no cervical adenopathy.  Neurological: She is alert.  Skin: No rash noted. She is not diaphoretic. No erythema. Nails show no clubbing.    Diagnostics: Allergy skin tests were performed.  She did not demonstrate any hypersensitivity to shrimp.  Assessment and Plan:    1. Anaphylactic shock due to food, subsequent encounter     Taisa very well  may have had a hypersensitivity reaction in the past directed against shrimp but this does not appear to be the case at this point in time.  Given the fact that she can eat lobster and crab and other forms of shellfish and she had a negative skin test directed against shrimp I do not think that there is any need for avoidance measures at this point in time.  She can perform an in clinic food challenge with shrimp.  Jiles Prows, MD Allergy / Immunology [facility]

## 2018-04-15 ENCOUNTER — Encounter: Payer: Self-pay | Admitting: Allergy and Immunology

## 2018-04-27 DIAGNOSIS — Z6839 Body mass index (BMI) 39.0-39.9, adult: Secondary | ICD-10-CM | POA: Diagnosis not present

## 2018-04-27 DIAGNOSIS — R05 Cough: Secondary | ICD-10-CM | POA: Diagnosis not present

## 2018-04-27 DIAGNOSIS — I10 Essential (primary) hypertension: Secondary | ICD-10-CM | POA: Diagnosis not present

## 2018-08-11 DIAGNOSIS — E669 Obesity, unspecified: Secondary | ICD-10-CM | POA: Diagnosis not present

## 2018-08-11 DIAGNOSIS — Z79899 Other long term (current) drug therapy: Secondary | ICD-10-CM | POA: Diagnosis not present

## 2018-08-11 DIAGNOSIS — I10 Essential (primary) hypertension: Secondary | ICD-10-CM | POA: Diagnosis not present

## 2018-08-11 DIAGNOSIS — Z6839 Body mass index (BMI) 39.0-39.9, adult: Secondary | ICD-10-CM | POA: Diagnosis not present

## 2018-08-11 DIAGNOSIS — M7989 Other specified soft tissue disorders: Secondary | ICD-10-CM | POA: Diagnosis not present

## 2019-01-19 DIAGNOSIS — R232 Flushing: Secondary | ICD-10-CM | POA: Diagnosis not present

## 2019-01-19 DIAGNOSIS — M7989 Other specified soft tissue disorders: Secondary | ICD-10-CM | POA: Diagnosis not present

## 2019-01-19 DIAGNOSIS — I10 Essential (primary) hypertension: Secondary | ICD-10-CM | POA: Diagnosis not present

## 2019-01-19 DIAGNOSIS — Z79899 Other long term (current) drug therapy: Secondary | ICD-10-CM | POA: Diagnosis not present

## 2019-01-28 DIAGNOSIS — M1712 Unilateral primary osteoarthritis, left knee: Secondary | ICD-10-CM | POA: Diagnosis not present

## 2019-01-28 DIAGNOSIS — M25562 Pain in left knee: Secondary | ICD-10-CM | POA: Diagnosis not present

## 2019-02-02 DIAGNOSIS — D485 Neoplasm of uncertain behavior of skin: Secondary | ICD-10-CM | POA: Diagnosis not present

## 2019-02-02 DIAGNOSIS — C4371 Malignant melanoma of right lower limb, including hip: Secondary | ICD-10-CM | POA: Diagnosis not present

## 2019-02-02 DIAGNOSIS — L821 Other seborrheic keratosis: Secondary | ICD-10-CM | POA: Diagnosis not present

## 2019-02-04 DIAGNOSIS — C4372 Malignant melanoma of left lower limb, including hip: Secondary | ICD-10-CM | POA: Diagnosis not present

## 2019-02-05 DIAGNOSIS — M1712 Unilateral primary osteoarthritis, left knee: Secondary | ICD-10-CM | POA: Diagnosis not present

## 2019-02-11 DIAGNOSIS — M25562 Pain in left knee: Secondary | ICD-10-CM | POA: Diagnosis not present

## 2019-02-15 DIAGNOSIS — Z6837 Body mass index (BMI) 37.0-37.9, adult: Secondary | ICD-10-CM | POA: Diagnosis not present

## 2019-02-15 DIAGNOSIS — C4372 Malignant melanoma of left lower limb, including hip: Secondary | ICD-10-CM | POA: Insufficient documentation

## 2019-03-04 DIAGNOSIS — C4372 Malignant melanoma of left lower limb, including hip: Secondary | ICD-10-CM | POA: Diagnosis not present

## 2019-03-04 DIAGNOSIS — L905 Scar conditions and fibrosis of skin: Secondary | ICD-10-CM | POA: Diagnosis not present

## 2019-03-04 DIAGNOSIS — I10 Essential (primary) hypertension: Secondary | ICD-10-CM | POA: Diagnosis not present

## 2019-03-14 DIAGNOSIS — Z09 Encounter for follow-up examination after completed treatment for conditions other than malignant neoplasm: Secondary | ICD-10-CM | POA: Insufficient documentation

## 2019-03-28 DIAGNOSIS — C4372 Malignant melanoma of left lower limb, including hip: Secondary | ICD-10-CM | POA: Diagnosis not present

## 2019-04-18 DIAGNOSIS — C4372 Malignant melanoma of left lower limb, including hip: Secondary | ICD-10-CM | POA: Diagnosis not present

## 2019-04-18 DIAGNOSIS — T8131XS Disruption of external operation (surgical) wound, not elsewhere classified, sequela: Secondary | ICD-10-CM | POA: Diagnosis not present

## 2019-04-18 DIAGNOSIS — T8131XA Disruption of external operation (surgical) wound, not elsewhere classified, initial encounter: Secondary | ICD-10-CM | POA: Insufficient documentation

## 2019-05-16 DIAGNOSIS — T8131XS Disruption of external operation (surgical) wound, not elsewhere classified, sequela: Secondary | ICD-10-CM | POA: Diagnosis not present

## 2019-05-16 DIAGNOSIS — E669 Obesity, unspecified: Secondary | ICD-10-CM | POA: Diagnosis not present

## 2019-05-16 DIAGNOSIS — Z6839 Body mass index (BMI) 39.0-39.9, adult: Secondary | ICD-10-CM | POA: Insufficient documentation

## 2019-05-27 DIAGNOSIS — C801 Malignant (primary) neoplasm, unspecified: Secondary | ICD-10-CM

## 2019-05-27 HISTORY — DX: Malignant (primary) neoplasm, unspecified: C80.1

## 2019-06-27 DIAGNOSIS — C4372 Malignant melanoma of left lower limb, including hip: Secondary | ICD-10-CM | POA: Diagnosis not present

## 2019-07-16 DIAGNOSIS — L03113 Cellulitis of right upper limb: Secondary | ICD-10-CM | POA: Diagnosis not present

## 2019-07-16 DIAGNOSIS — R2231 Localized swelling, mass and lump, right upper limb: Secondary | ICD-10-CM | POA: Diagnosis not present

## 2019-07-18 DIAGNOSIS — L03818 Cellulitis of other sites: Secondary | ICD-10-CM | POA: Diagnosis not present

## 2019-07-18 DIAGNOSIS — Z6841 Body Mass Index (BMI) 40.0 and over, adult: Secondary | ICD-10-CM | POA: Diagnosis not present

## 2019-08-11 DIAGNOSIS — Z85820 Personal history of malignant melanoma of skin: Secondary | ICD-10-CM | POA: Diagnosis not present

## 2019-08-11 DIAGNOSIS — Z01419 Encounter for gynecological examination (general) (routine) without abnormal findings: Secondary | ICD-10-CM | POA: Diagnosis not present

## 2019-08-11 DIAGNOSIS — Z1231 Encounter for screening mammogram for malignant neoplasm of breast: Secondary | ICD-10-CM | POA: Diagnosis not present

## 2019-08-11 DIAGNOSIS — Z86010 Personal history of colonic polyps: Secondary | ICD-10-CM | POA: Diagnosis not present

## 2019-08-11 DIAGNOSIS — Z6839 Body mass index (BMI) 39.0-39.9, adult: Secondary | ICD-10-CM | POA: Diagnosis not present

## 2019-08-11 DIAGNOSIS — Z800 Family history of malignant neoplasm of digestive organs: Secondary | ICD-10-CM | POA: Diagnosis not present

## 2019-08-11 DIAGNOSIS — Z803 Family history of malignant neoplasm of breast: Secondary | ICD-10-CM | POA: Diagnosis not present

## 2019-08-25 DIAGNOSIS — M67441 Ganglion, right hand: Secondary | ICD-10-CM | POA: Diagnosis not present

## 2019-09-20 DIAGNOSIS — Z809 Family history of malignant neoplasm, unspecified: Secondary | ICD-10-CM | POA: Diagnosis not present

## 2019-09-23 ENCOUNTER — Other Ambulatory Visit: Payer: Self-pay | Admitting: Obstetrics and Gynecology

## 2019-09-23 DIAGNOSIS — Z809 Family history of malignant neoplasm, unspecified: Secondary | ICD-10-CM

## 2019-10-13 ENCOUNTER — Encounter: Payer: Self-pay | Admitting: Gastroenterology

## 2019-12-12 ENCOUNTER — Ambulatory Visit (AMBULATORY_SURGERY_CENTER): Payer: Self-pay

## 2019-12-12 ENCOUNTER — Other Ambulatory Visit: Payer: Self-pay

## 2019-12-12 VITALS — Ht 67.0 in | Wt 248.2 lb

## 2019-12-12 DIAGNOSIS — Z800 Family history of malignant neoplasm of digestive organs: Secondary | ICD-10-CM

## 2019-12-12 NOTE — Progress Notes (Signed)
No allergies to soy or egg Pt is not on blood thinners or diet pills Denies issues with sedation/intubation Denies atrial flutter/fib Denies constipation   Pt is aware of Covid safety and care partner requirements.      

## 2019-12-26 ENCOUNTER — Ambulatory Visit (AMBULATORY_SURGERY_CENTER): Payer: BC Managed Care – PPO | Admitting: Gastroenterology

## 2019-12-26 ENCOUNTER — Other Ambulatory Visit: Payer: Self-pay

## 2019-12-26 ENCOUNTER — Encounter: Payer: Self-pay | Admitting: Gastroenterology

## 2019-12-26 VITALS — BP 149/89 | HR 68 | Temp 97.5°F | Resp 16 | Ht 67.0 in | Wt 248.0 lb

## 2019-12-26 DIAGNOSIS — Z86010 Personal history of colonic polyps: Secondary | ICD-10-CM | POA: Diagnosis not present

## 2019-12-26 DIAGNOSIS — Z1211 Encounter for screening for malignant neoplasm of colon: Secondary | ICD-10-CM | POA: Diagnosis not present

## 2019-12-26 DIAGNOSIS — D122 Benign neoplasm of ascending colon: Secondary | ICD-10-CM

## 2019-12-26 DIAGNOSIS — D125 Benign neoplasm of sigmoid colon: Secondary | ICD-10-CM | POA: Diagnosis not present

## 2019-12-26 DIAGNOSIS — D123 Benign neoplasm of transverse colon: Secondary | ICD-10-CM | POA: Diagnosis not present

## 2019-12-26 MED ORDER — SODIUM CHLORIDE 0.9 % IV SOLN: 500.0000 mL | Freq: Once | INTRAVENOUS | Status: DC

## 2019-12-26 NOTE — Progress Notes (Signed)
Vital signs checked by:WR  The medical and surgical history was reviewed and verified with the patient.

## 2019-12-26 NOTE — Patient Instructions (Signed)
YOU HAD AN ENDOSCOPIC PROCEDURE TODAY AT THE [facility]:   Refer to the procedure report that was given to you for any specific questions about what was found during the examination.  If the procedure report does not answer your questions, please call your gastroenterologist to clarify.  If you requested that your care partner not be given the details of your procedure findings, then the procedure report has been included in a sealed envelope for you to review at your convenience later.  YOU SHOULD EXPECT: Some feelings of bloating in the abdomen. Passage of more gas than usual.  Walking can help get rid of the air that was put into your GI tract during the procedure and reduce the bloating. If you had a lower endoscopy (such as a colonoscopy or flexible sigmoidoscopy) you may notice spotting of blood in your stool or on the toilet paper. If you underwent a bowel prep for your procedure, you may not have a normal bowel movement for a few days.  Please Note:  You might notice some irritation and congestion in your nose or some drainage.  This is from the oxygen used during your procedure.  There is no need for concern and it should clear up in a day or so.  SYMPTOMS TO REPORT IMMEDIATELY:   Following lower endoscopy (colonoscopy or flexible sigmoidoscopy):  Excessive amounts of blood in the stool  Significant tenderness or worsening of abdominal pains  Swelling of the abdomen that is new, acute  Fever of 100F or higher    For urgent or emergent issues, a gastroenterologist can be reached at any hour by calling [phone]. Do not use MyChart messaging for urgent concerns.    DIET:  We do recommend a small meal at first, but then you may proceed to your regular diet.  Drink plenty of fluids but you should avoid alcoholic beverages for 24 hours.  ACTIVITY:  You should plan to take it easy for the rest of today and you should NOT DRIVE or use heavy machinery until tomorrow  (because of the sedation medicines used during the test).    FOLLOW UP: Our staff will call the number listed on your records 48-72 hours following your procedure to check on you and address any questions or concerns that you may have regarding the information given to you following your procedure. If we do not reach you, we will leave a message.  We will attempt to reach you two times.  During this call, we will ask if you have developed any symptoms of COVID 19. If you develop any symptoms (ie: fever, flu-like symptoms, shortness of breath, cough etc.) before then, please call [phone].  If you test positive for Covid 19 in the 2 weeks post procedure, please call and report this information to us.    If any biopsies were taken you will be contacted by phone or by letter within the next 1-3 weeks.  Please call us at [phone] if you have not heard about the biopsies in 3 weeks.    SIGNATURES/CONFIDENTIALITY: You and/or your care partner have signed paperwork which will be entered into your electronic medical record.  These signatures attest to the fact that that the information above on your After Visit Summary has been reviewed and is understood.  Full responsibility of the confidentiality of this discharge information lies with you and/or your care-partner.   Resume medications. Information given on polyps,diverticulosis and hemorrhoids. 

## 2019-12-26 NOTE — Progress Notes (Signed)
To pacu, vss. Report to [email or web address] 

## 2019-12-26 NOTE — Progress Notes (Signed)
Called to room to assist during endoscopic procedure.  Patient ID and intended procedure confirmed with present staff. Received instructions for my participation in the procedure from the performing physician.  

## 2019-12-26 NOTE — Op Note (Signed)
[facility] Patient Name: Rhonda Roberson Procedure Date: 12/26/2019 8:22 AM MRN: [id number] Endoscopist: Jackquline Denmark , MD Age: 59 Referring MD:  Date of Birth: 1960/10/15 Gender: Female Account #: [id number] Procedure:                Colonoscopy Indications:              High risk colon cancer surveillance: Personal                            history of colonic polyps. FH colon can (mom age                            <17) Medicines:                Monitored Anesthesia Care Procedure:                Pre-Anesthesia Assessment:                           - Prior to the procedure, a History and Physical                            was performed, and patient medications and                            allergies were reviewed. The patient's tolerance of                            previous anesthesia was also reviewed. The risks                            and benefits of the procedure and the sedation                            options and risks were discussed with the patient.                            All questions were answered, and informed consent                            was obtained. Prior Anticoagulants: The patient has                            taken no previous anticoagulant or antiplatelet                            agents. ASA Grade Assessment: II - A patient with                            mild systemic disease. After reviewing the risks                            and benefits, the patient was deemed in  satisfactory condition to undergo the procedure.                           After obtaining informed consent, the colonoscope                            was passed under direct vision. Throughout the                            procedure, the patient's blood pressure, pulse, and                            oxygen saturations were monitored continuously. The                            Colonoscope was introduced through the anus and                             advanced to the 2 cm into the ileum. The                            colonoscopy was performed without difficulty. The                            patient tolerated the procedure well. The quality                            of the bowel preparation was good. The terminal                            ileum, ileocecal valve, appendiceal orifice, and                            rectum were photographed. Scope In: 8:35:52 AM Scope Out: 8:47:51 AM Scope Withdrawal Time: 0 hours 9 minutes 34 seconds  Total Procedure Duration: 0 hours 11 minutes 59 seconds  Findings:                 Three sessile polyps were found in the proximal                            sigmoid colon, mid transverse colon and ascending                            colon. The polyps were 6 to 8 mm in size. These                            polyps were removed with a cold snare. Resection                            and retrieval were complete.                           A few rare small-mouthed diverticula were found in  the sigmoid colon.                           Non-bleeding internal hemorrhoids were found during                            retroflexion. The hemorrhoids were small.                           The terminal ileum appeared normal.                           The exam was otherwise without abnormality on                            direct and retroflexion views. Complications:            No immediate complications. Estimated Blood Loss:     Estimated blood loss: none. Impression:               -Small colonic polyps s/p polypectomy.                           -Minimal sigmoid diverticulosis.                           -Otherwise normal colonoscopy to TI. Recommendation:           - Patient has a contact number available for                            emergencies. The signs and symptoms of potential                            delayed complications were discussed with the                             patient. Return to normal activities tomorrow.                            Written discharge instructions were provided to the                            patient.                           - Resume previous diet.                           - Continue present medications.                           - Await pathology results.                           - Repeat colonoscopy for surveillance based on  pathology results.                           - The findings and recommendations were discussed                            with Rhonda Roberson. Jackquline Denmark, MD 12/26/2019 8:53:40 AM This report has been signed electronically.

## 2019-12-28 ENCOUNTER — Encounter: Payer: Self-pay | Admitting: Gastroenterology

## 2019-12-28 ENCOUNTER — Telehealth: Payer: Self-pay | Admitting: *Deleted

## 2019-12-28 ENCOUNTER — Telehealth: Payer: Self-pay

## 2019-12-28 NOTE — Telephone Encounter (Signed)
Left message on f/u ncall

## 2019-12-28 NOTE — Telephone Encounter (Signed)
°  Follow up Call-  Call back number 12/26/2019  Post procedure Call Back phone  # [phone]  Permission to leave phone message Yes  Some recent data might be hidden     Patient questions:  Do you have a fever, pain , or abdominal swelling? No. Pain Score  0 *  Have you tolerated food without any problems? Yes.    Have you been able to return to your normal activities? Yes.    Do you have any questions about your discharge instructions: Diet   No. Medications  No. Follow up visit  No.  Do you have questions or concerns about your Care? No.  Actions: * If pain score is 4 or above: No action needed, pain <4. 1. Have you developed a fever since your procedure? no  2.   Have you had an respiratory symptoms (SOB or cough) since your procedure? no  3.   Have you tested positive for COVID 19 since your procedure no  4.   Have you had any family members/close contacts diagnosed with the COVID 19 since your procedure?  no   If yes to any of these questions please route to Joylene John, RN and Erenest Rasher, RN

## 2020-01-30 DIAGNOSIS — L259 Unspecified contact dermatitis, unspecified cause: Secondary | ICD-10-CM | POA: Diagnosis not present

## 2020-02-02 DIAGNOSIS — L237 Allergic contact dermatitis due to plants, except food: Secondary | ICD-10-CM | POA: Diagnosis not present

## 2020-02-02 DIAGNOSIS — L821 Other seborrheic keratosis: Secondary | ICD-10-CM | POA: Diagnosis not present

## 2020-02-02 DIAGNOSIS — L299 Pruritus, unspecified: Secondary | ICD-10-CM | POA: Diagnosis not present

## 2020-02-07 DIAGNOSIS — D2239 Melanocytic nevi of other parts of face: Secondary | ICD-10-CM | POA: Diagnosis not present

## 2020-02-07 DIAGNOSIS — Z85820 Personal history of malignant melanoma of skin: Secondary | ICD-10-CM | POA: Diagnosis not present

## 2020-02-07 DIAGNOSIS — D485 Neoplasm of uncertain behavior of skin: Secondary | ICD-10-CM | POA: Diagnosis not present

## 2020-02-07 DIAGNOSIS — D225 Melanocytic nevi of trunk: Secondary | ICD-10-CM | POA: Diagnosis not present

## 2020-02-07 DIAGNOSIS — L237 Allergic contact dermatitis due to plants, except food: Secondary | ICD-10-CM | POA: Diagnosis not present

## 2020-02-07 DIAGNOSIS — Z79899 Other long term (current) drug therapy: Secondary | ICD-10-CM | POA: Diagnosis not present

## 2020-02-07 DIAGNOSIS — I10 Essential (primary) hypertension: Secondary | ICD-10-CM | POA: Diagnosis not present

## 2020-09-13 DIAGNOSIS — D485 Neoplasm of uncertain behavior of skin: Secondary | ICD-10-CM | POA: Diagnosis not present

## 2020-09-13 DIAGNOSIS — L820 Inflamed seborrheic keratosis: Secondary | ICD-10-CM | POA: Diagnosis not present

## 2020-09-13 DIAGNOSIS — D2239 Melanocytic nevi of other parts of face: Secondary | ICD-10-CM | POA: Diagnosis not present

## 2020-09-13 DIAGNOSIS — D225 Melanocytic nevi of trunk: Secondary | ICD-10-CM | POA: Diagnosis not present

## 2020-09-13 DIAGNOSIS — D1801 Hemangioma of skin and subcutaneous tissue: Secondary | ICD-10-CM | POA: Diagnosis not present

## 2020-09-13 DIAGNOSIS — Z85820 Personal history of malignant melanoma of skin: Secondary | ICD-10-CM | POA: Diagnosis not present

## 2020-09-21 ENCOUNTER — Other Ambulatory Visit (HOSPITAL_COMMUNITY): Payer: Self-pay | Admitting: Orthopedic Surgery

## 2020-09-21 ENCOUNTER — Ambulatory Visit (HOSPITAL_COMMUNITY)
Admission: RE | Admit: 2020-09-21 | Discharge: 2020-09-21 | Disposition: A | Discharge status: Home / Self Care | Payer: BC Managed Care – PPO | Source: Ambulatory Visit | Attending: Orthopedic Surgery | Admitting: Orthopedic Surgery

## 2020-09-21 ENCOUNTER — Other Ambulatory Visit: Payer: Self-pay

## 2020-09-21 DIAGNOSIS — M7989 Other specified soft tissue disorders: Secondary | ICD-10-CM | POA: Insufficient documentation

## 2020-09-21 DIAGNOSIS — M79661 Pain in right lower leg: Secondary | ICD-10-CM | POA: Insufficient documentation

## 2020-09-21 DIAGNOSIS — M79605 Pain in left leg: Secondary | ICD-10-CM | POA: Diagnosis not present

## 2020-09-21 DIAGNOSIS — M79604 Pain in right leg: Secondary | ICD-10-CM | POA: Diagnosis not present

## 2020-09-21 DIAGNOSIS — M1712 Unilateral primary osteoarthritis, left knee: Secondary | ICD-10-CM | POA: Diagnosis not present

## 2020-10-04 DIAGNOSIS — R600 Localized edema: Secondary | ICD-10-CM | POA: Diagnosis not present

## 2020-10-04 DIAGNOSIS — Z6841 Body Mass Index (BMI) 40.0 and over, adult: Secondary | ICD-10-CM | POA: Diagnosis not present

## 2020-10-04 DIAGNOSIS — I10 Essential (primary) hypertension: Secondary | ICD-10-CM | POA: Diagnosis not present

## 2020-10-11 DIAGNOSIS — I10 Essential (primary) hypertension: Secondary | ICD-10-CM | POA: Diagnosis not present

## 2020-10-18 ENCOUNTER — Other Ambulatory Visit: Payer: Self-pay | Admitting: *Deleted

## 2020-10-18 DIAGNOSIS — M79606 Pain in leg, unspecified: Secondary | ICD-10-CM

## 2020-10-25 ENCOUNTER — Encounter (HOSPITAL_COMMUNITY): Payer: BC Managed Care – PPO

## 2020-11-14 DIAGNOSIS — M9903 Segmental and somatic dysfunction of lumbar region: Secondary | ICD-10-CM | POA: Diagnosis not present

## 2020-11-14 DIAGNOSIS — M9902 Segmental and somatic dysfunction of thoracic region: Secondary | ICD-10-CM | POA: Diagnosis not present

## 2020-11-14 DIAGNOSIS — M542 Cervicalgia: Secondary | ICD-10-CM | POA: Diagnosis not present

## 2020-11-14 DIAGNOSIS — M9901 Segmental and somatic dysfunction of cervical region: Secondary | ICD-10-CM | POA: Diagnosis not present

## 2020-11-19 DIAGNOSIS — M542 Cervicalgia: Secondary | ICD-10-CM | POA: Diagnosis not present

## 2020-11-19 DIAGNOSIS — M9901 Segmental and somatic dysfunction of cervical region: Secondary | ICD-10-CM | POA: Diagnosis not present

## 2020-11-19 DIAGNOSIS — M9903 Segmental and somatic dysfunction of lumbar region: Secondary | ICD-10-CM | POA: Diagnosis not present

## 2020-11-19 DIAGNOSIS — M9902 Segmental and somatic dysfunction of thoracic region: Secondary | ICD-10-CM | POA: Diagnosis not present

## 2020-12-11 ENCOUNTER — Ambulatory Visit (HOSPITAL_COMMUNITY): Payer: BC Managed Care – PPO

## 2020-12-12 ENCOUNTER — Ambulatory Visit (INDEPENDENT_AMBULATORY_CARE_PROVIDER_SITE_OTHER): Payer: BC Managed Care – PPO | Admitting: Physician Assistant

## 2020-12-12 ENCOUNTER — Ambulatory Visit (HOSPITAL_COMMUNITY)
Admission: RE | Admit: 2020-12-12 | Discharge: 2020-12-12 | Disposition: A | Discharge status: Home / Self Care | Payer: BC Managed Care – PPO | Source: Ambulatory Visit | Attending: Vascular Surgery | Admitting: Vascular Surgery

## 2020-12-12 ENCOUNTER — Other Ambulatory Visit: Payer: Self-pay

## 2020-12-12 VITALS — BP 154/100 | HR 87 | Temp 98.1°F | Resp 20 | Ht 67.0 in | Wt 249.2 lb

## 2020-12-12 DIAGNOSIS — M7989 Other specified soft tissue disorders: Secondary | ICD-10-CM

## 2020-12-12 DIAGNOSIS — M79606 Pain in leg, unspecified: Secondary | ICD-10-CM

## 2020-12-12 DIAGNOSIS — I872 Venous insufficiency (chronic) (peripheral): Secondary | ICD-10-CM | POA: Diagnosis not present

## 2020-12-12 DIAGNOSIS — M79605 Pain in left leg: Secondary | ICD-10-CM | POA: Diagnosis not present

## 2020-12-12 NOTE — Progress Notes (Signed)
Office Note     CC:  follow up Requesting Provider:  Ernestene Kiel, MD  HPI: Rhonda Roberson is a 59 y.o. (1960/08/16) female who presents for evaluation of left lower extremity edema.  She notices worsening edema of left lower extremity since March of this year.  She denies any history of DVT, venous ulcerations, trauma, or prior vascular interventions.  She works as an Chief Financial Officer and sits behind a desk throughout the day in addition to a 58 mile each way commute to work.  She wears compression during her commute to and from however does not wear compression at work.  She does not elevate her leg during the day.  She denies tobacco use.  She also denies claudication or nonhealing wounds of bilateral lower extremities.   Past Medical History:  Diagnosis Date   Cancer ([place])    melanoma   Hypertension     Past Surgical History:  Procedure Laterality Date   ABDOMINAL HYSTERECTOMY     partial   COLONOSCOPY  2018   MASS EXCISION  05/23/2011   Procedure: MINOR EXCISION OF MASS;  Surgeon: Cammie Sickle., MD;  Location: [facility];  Service: Orthopedics;  Laterality: Right;  excision mucoid cyst with DIP joint arthrotomy right ring finger   MASS EXCISION Right 01/26/2018   Procedure: EXCISION MASS;  Surgeon: Daryll Brod, MD;  Location: [facility];  Service: Orthopedics;  Laterality: Right;   ROTATION FLAP Right 01/26/2018   Procedure: ROTATION FLAP;  Surgeon: Daryll Brod, MD;  Location: [facility];  Service: Orthopedics;  Laterality: Right;    Social History   Socioeconomic History   Marital status: Married    Spouse name: Not on file   Number of children: Not on file   Years of education: Not on file   Highest education level: Not on file  Occupational History   Not on file  Tobacco Use   Smoking status: Never   Smokeless tobacco: Never  Vaping Use   Vaping Use: Never used  Substance and Sexual Activity   Alcohol use: Yes     Comment: social   Drug use: Never   Sexual activity: Not on file  Other Topics Concern   Not on file  Social History Narrative   Not on file   Social Determinants of Health   Financial Resource Strain: Not on file  Food Insecurity: Not on file  Transportation Needs: Not on file  Physical Activity: Not on file  Stress: Not on file  Social Connections: Not on file  Intimate Partner Violence: Not on file    Family History  Problem Relation Age of Onset   Hypertension Mother    Cancer - Colon Mother    Breast cancer Mother    Liver cancer Mother    Colon cancer Mother 36   Hypertension Father    Diabetes Paternal Grandfather    Colon polyps Neg Hx    Esophageal cancer Neg Hx    Rectal cancer Neg Hx    Stomach cancer Neg Hx     Current Outpatient Medications  Medication Sig Dispense Refill   furosemide (LASIX) 40 MG tablet TAKE 1 TABLET EVERY DAY IN THE MORNING AND 1/2 TABLET IN THE AFTERNOON AS NEEDED FOR SWELLING  7   valsartan-hydrochlorothiazide (DIOVAN-HCT) 80-12.5 MG tablet Take 1 tablet by mouth daily.     No current facility-administered medications for this visit.    Allergies  Allergen Reactions   Penicillins  Rash     REVIEW OF SYSTEMS:   [X]  denotes positive finding, [ ]  denotes negative finding Cardiac  Comments:  Chest pain or chest pressure:    Shortness of breath upon exertion:    Short of breath when lying flat:    Irregular heart rhythm:        Vascular    Pain in calf, thigh, or hip brought on by ambulation:    Pain in feet at night that wakes you up from your sleep:     Blood clot in your veins:    Leg swelling:         Pulmonary    Oxygen at home:    Productive cough:     Wheezing:         Neurologic    Sudden weakness in arms or legs:     Sudden numbness in arms or legs:     Sudden onset of difficulty speaking or slurred speech:    Temporary loss of vision in one eye:     Problems with dizziness:         Gastrointestinal     Blood in stool:     Vomited blood:         Genitourinary    Burning when urinating:     Blood in urine:        Psychiatric    Major depression:         Hematologic    Bleeding problems:    Problems with blood clotting too easily:        Skin    Rashes or ulcers:        Constitutional    Fever or chills:      PHYSICAL EXAMINATION:  Vitals:   12/12/20 1444  BP: (!) 154/100  Pulse: 87  Resp: 20  Temp: 98.1 F (36.7 C)  TempSrc: Temporal  SpO2: 98%  Weight: 249 lb 3.2 oz (113 kg)  Height: 5\' 7"  (1.702 m)    General:  WDWN in NAD; vital signs documented above Gait: Not observed HENT: WNL, normocephalic Pulmonary: normal non-labored breathing Cardiac: regular HR Abdomen: soft, NT, no masses Skin: without rashes Extremities: without ischemic changes, without Gangrene , without cellulitis; without open wounds; edema L>R Musculoskeletal: no muscle wasting or atrophy  Neurologic: A&O X 3;  No focal weakness or paresthesias are detected Psychiatric:  The pt has Normal affect.   Non-Invasive Vascular Imaging:   The left saphenofemoral junction reflux only Negative for DVT   ASSESSMENT/PLAN:: 60 y.o. female here for evaluation of left lower extremity edema  -Left lower extremity venous reflux study was negative for DVT.  Only venous insufficiency was seen at the saphenofemoral junction however saphenous vein is small in diameter -Patient was measured for and purchased knee-high 15 to 20 mm compression which I have recommended wearing all day and every day -Also discussed proper leg elevation which should be done periodically throughout the day -Recommendations also included avoiding prolonged sitting and standing and taking NSAIDs for discomfort associated with left lower extremity edema -Patient will follow up on an as-needed basis   Dagoberto Ligas, PA-C [facility] [phone]  Clinic MD:   Scot Dock

## 2021-02-06 DIAGNOSIS — I10 Essential (primary) hypertension: Secondary | ICD-10-CM | POA: Diagnosis not present

## 2021-02-06 DIAGNOSIS — Z6841 Body Mass Index (BMI) 40.0 and over, adult: Secondary | ICD-10-CM | POA: Diagnosis not present

## 2021-02-06 DIAGNOSIS — Z79899 Other long term (current) drug therapy: Secondary | ICD-10-CM | POA: Diagnosis not present

## 2021-02-06 DIAGNOSIS — I890 Lymphedema, not elsewhere classified: Secondary | ICD-10-CM | POA: Diagnosis not present

## 2021-02-06 DIAGNOSIS — M7989 Other specified soft tissue disorders: Secondary | ICD-10-CM | POA: Diagnosis not present

## 2021-02-20 DIAGNOSIS — I10 Essential (primary) hypertension: Secondary | ICD-10-CM | POA: Diagnosis not present

## 2021-02-25 ENCOUNTER — Other Ambulatory Visit (HOSPITAL_COMMUNITY): Payer: Self-pay | Admitting: Surgery

## 2021-02-26 DIAGNOSIS — I10 Essential (primary) hypertension: Secondary | ICD-10-CM | POA: Diagnosis not present

## 2021-02-26 DIAGNOSIS — Z6841 Body Mass Index (BMI) 40.0 and over, adult: Secondary | ICD-10-CM | POA: Diagnosis not present

## 2021-02-26 DIAGNOSIS — Z01419 Encounter for gynecological examination (general) (routine) without abnormal findings: Secondary | ICD-10-CM | POA: Diagnosis not present

## 2021-02-26 DIAGNOSIS — Z1231 Encounter for screening mammogram for malignant neoplasm of breast: Secondary | ICD-10-CM | POA: Diagnosis not present

## 2021-03-12 ENCOUNTER — Ambulatory Visit (HOSPITAL_COMMUNITY)
Admission: RE | Admit: 2021-03-12 | Discharge: 2021-03-12 | Disposition: A | Discharge status: Home / Self Care | Payer: BC Managed Care – PPO | Source: Ambulatory Visit | Attending: Surgery | Admitting: Surgery

## 2021-03-12 ENCOUNTER — Other Ambulatory Visit: Payer: Self-pay

## 2021-03-12 DIAGNOSIS — K219 Gastro-esophageal reflux disease without esophagitis: Secondary | ICD-10-CM | POA: Diagnosis not present

## 2021-03-12 DIAGNOSIS — M47814 Spondylosis without myelopathy or radiculopathy, thoracic region: Secondary | ICD-10-CM | POA: Diagnosis not present

## 2021-03-27 DIAGNOSIS — F509 Eating disorder, unspecified: Secondary | ICD-10-CM | POA: Diagnosis not present

## 2021-04-16 ENCOUNTER — Encounter: Payer: Self-pay | Admitting: Skilled Nursing Facility1

## 2021-04-16 ENCOUNTER — Other Ambulatory Visit: Payer: Self-pay

## 2021-04-16 ENCOUNTER — Encounter: Payer: BC Managed Care – PPO | Attending: Surgery | Admitting: Skilled Nursing Facility1

## 2021-04-16 DIAGNOSIS — E669 Obesity, unspecified: Secondary | ICD-10-CM | POA: Diagnosis not present

## 2021-04-16 NOTE — Progress Notes (Signed)
Nutrition Assessment for Bariatric Surgery Medical Nutrition Therapy  Patient was seen on 04/16/2021 for Pre-Operative Nutrition Assessment. Letter of approval faxed to [facility] bariatric surgery program coordinator on 04/16/2021.   Referral stated Supervised Weight Loss (SWL) visits needed: 0  Pt completed visits.   Pt has cleared nutrition requirements.    Planned surgery: Sleeve Gastrectomy  Pt expectation of surgery: to lose weight Pt expectation of dietitian: to help educate    NUTRITION ASSESSMENT   Anthropometrics  Start weight at [facility]: 253.7 lbs (date: 04/16/2021)  Height: 67 in BMI: 39.74 kg/m2     Clinical  Medical hx: melanoma, HTN Medications: see list; b12  Labs: HDL 36, LDL 128, calcium 8.6 Notable signs/symptoms: N/A Any previous deficiencies? No  Micronutrient Nutrition Focused Physical Exam: Hair: No issues observed Eyes: No issues observed Mouth: No issues observed Neck: No issues observed Nails: No issues observed Skin: No issues observed  Lifestyle & Dietary Hx  Pt arrives with her supportive husband whom is also having the surgery at the same time. Pt states she has already been making changes with her diet and lifestyle and recognizes the importance of making changes.    24-Hr Dietary Recall First Meal: egg + bacon  Snack: wheat thins Second Meal: skipped Snack: dried fruit Third Meal: chicken alfredo + salad: onion, lettuce, cheese, [place] Snack: ice cream Beverages: soda, sweet tea, water   Estimated Energy Needs Calories: 1500   NUTRITION DIAGNOSIS  Overweight/obesity ([place]-3.3) related to past poor dietary habits and physical inactivity as evidenced by patient w/ planned sleeve gastrectomy  surgery following dietary guidelines for continued weight loss.    NUTRITION INTERVENTION  Nutrition counseling (C-1) and education (E-2) to facilitate bariatric surgery goals.  Educated pt on micronutrient  deficiencies post surgery and strategies to mitigate that risk   Pre-Op Goals Reviewed with the Patient Track food and beverage intake (pen and paper, MyFitness Pal, Baritastic app, etc.) Make healthy food choices while monitoring portion sizes Consume 3 meals per day or try to eat every 3-5 hours Avoid concentrated sugars and fried foods Keep sugar & fat in the single digits per serving on food labels Practice CHEWING your food (aim for applesauce consistency) Practice not drinking 15 minutes before, during, and 30 minutes after each meal and snack Avoid all carbonated beverages (ex: soda, sparkling beverages)  Limit caffeinated beverages (ex: coffee, tea, energy drinks) Avoid all sugar-sweetened beverages (ex: regular soda, sports drinks)  Avoid alcohol  Aim for 64-100 ounces of FLUID daily (with at least half of fluid intake being plain water)  Aim for at least 60-80 grams of PROTEIN daily Look for a liquid protein source that contains ?15 g protein and ?5 g carbohydrate (ex: shakes, drinks, shots) Make a list of non-food related activities Physical activity is an important part of a healthy lifestyle so keep it moving! The goal is to reach 150 minutes of exercise per week, including cardiovascular and weight baring activity.  *Goals that are bolded indicate the pt would like to start working towards these  Handouts Provided Include  Bariatric Surgery handouts (Nutrition Visits, Pre-Op Goals, Protein Shakes, Vitamins & Minerals)  Learning Style & Readiness for Change Teaching method utilized: Visual & Auditory  Demonstrated degree of understanding via: Teach Back  Readiness Level: contemplative/Action Barriers to learning/adherence to lifestyle change: none identified    MONITORING & EVALUATION Dietary intake, weekly physical activity, body weight, and pre-op goals reached at next nutrition visit.    Next  Steps  Patient is to follow up at [facility] for Pre-Op Class >2 weeks before  surgery for further nutrition education.  Pt has completed visits. No further supervised visits required/recomended

## 2021-05-08 DIAGNOSIS — F509 Eating disorder, unspecified: Secondary | ICD-10-CM | POA: Diagnosis not present

## 2021-06-10 ENCOUNTER — Ambulatory Visit: Payer: BC Managed Care – PPO

## 2021-06-13 NOTE — Progress Notes (Signed)
Sent message, via epic in basket, requesting orders in epic from surgeon.  

## 2021-06-14 NOTE — Patient Instructions (Addendum)
DUE TO COVID-19 ONLY ONE VISITOR IS ALLOWED TO COME WITH YOU AND STAY IN THE WAITING ROOM ONLY DURING PRE OP AND PROCEDURE.   **NO VISITORS ARE ALLOWED IN THE SHORT STAY AREA OR RECOVERY ROOM!!**  IF YOU WILL BE ADMITTED INTO THE HOSPITAL YOU ARE ALLOWED ONLY TWO SUPPORT PEOPLE DURING VISITATION HOURS ONLY (7 AM -8PM)    Up to two visitors ages 29+ are allowed at one time in a patient's room.  The visitors may rotate out with other people throughout the day.  Additionally, up to two children between the ages of 59 and 36 are allowed and do not count toward the number of allowed visitors.  Children within this age range must be accompanied by an adult visitor.  One adult visitor may remain with the patient overnight and must be in the room by 8 PM.  COVID SWAB TESTING MUST BE COMPLETED ON:  06-28-20 @ 8:15 AM  COME IN THROUGH MAIN ENTRANCE of [facility].  Take a seat in the lobby area to the right as you come in the main entrance.  Call [phone]  and give your name and let them know you are here for COVID testing  You are not required to quarantine, however you are required to wear a well-fitted mask when you are out and around people not in your household.  Hand Hygiene often Do NOT share personal items Notify your provider if you are in close contact with someone who has COVID or you develop fever 100.4 or greater, new onset of sneezing, cough, sore throat, shortness of breath or body aches.   Your procedure is scheduled on: Thursday 07-02-21   Report to [facility] Main  Entrance     Report to admitting at 12:15 PM   Call this number if you have problems the morning of surgery [phone]   Do not eat food :After 6 PM.   May have liquids until 11:30 AM day of surgery  CLEAR LIQUID DIET  Foods Allowed                                                                     Foods Excluded  Water, Black Coffee (no milk/no creamer) and tea, regular and decaf                               liquids that you cannot  Plain Jell-O in any flavor  (No red)                         see through such as: Fruit ices (not with fruit pulp)                                 milk, soups, orange juice  Iced Popsicles (No red)                                    All solid food  Apple juices Sports drinks like Gatorade (No red) Lightly seasoned clear broth or consume(fat free) Sugar Sample Menu Breakfast                                Lunch                                     Supper Cranberry juice                    Beef broth                            Chicken broth Jell-O                                     Grape juice                           Apple juice Coffee or tea                        Jell-O                                      Popsicle                                                Coffee or tea                        Coffee or tea      Oral Hygiene is also important to reduce your risk of infection.                                    Remember - BRUSH YOUR TEETH THE MORNING OF SURGERY WITH YOUR REGULAR TOOTHPASTE   Do NOT smoke after Midnight   Take these medicines the morning of surgery with A SIP OF WATER: None   Stop all vitamins and herbal supplements a week before surgery             You may not have any metal on your body including hair pins, jewelry, and body piercing             Do not wear make-up, lotions, powders, perfumes or deodorant  Do not wear nail polish including gel and S&S, artificial/acrylic nails, or any other type of covering on natural nails including finger and toenails. If you have artificial nails, gel coating, etc. that needs to be removed by a nail salon please have this removed prior to surgery or surgery may need to be canceled/ delayed if the surgeon/ anesthesia feels like they are unable to be safely monitored.   Do not shave  48 hours prior to surgery.   Do not bring valuables to the hospital. [place].   Contacts, dentures or bridgework may  not be worn into surgery.   Bring small overnight bag day of surgery.    Special Instructions: Bring a copy of your healthcare power of attorney and living will documents the day of surgery if you haven't scanned them in before.  Please read over the following fact sheets you were given: IF YOU HAVE QUESTIONS ABOUT YOUR PRE OP INSTRUCTIONS PLEASE CALL [facility] - Preparing for Surgery Before surgery, you can play an important role.  Because skin is not sterile, your skin needs to be as free of germs as possible.  You can reduce the number of germs on your skin by washing with CHG (chlorahexidine gluconate) soap before surgery.  CHG is an antiseptic cleaner which kills germs and bonds with the skin to continue killing germs even after washing. Please DO NOT use if you have an allergy to CHG or antibacterial soaps.  If your skin becomes reddened/irritated stop using the CHG and inform your nurse when you arrive at Short Stay. Do not shave (including legs and underarms) for at least 48 hours prior to the first CHG shower.  You may shave your face/neck.  Please follow these instructions carefully:  1.  Shower with CHG Soap the night before surgery and the  morning of surgery.  2.  If you choose to wash your hair, wash your hair first as usual with your normal  shampoo.  3.  After you shampoo, rinse your hair and body thoroughly to remove the shampoo.                             4.  Use CHG as you would any other liquid soap.  You can apply chg directly to the skin and wash.  Gently with a scrungie or clean washcloth.  5.  Apply the CHG Soap to your body ONLY FROM THE NECK DOWN.   Do   not use on face/ open                           Wound or open sores. Avoid contact with eyes, ears mouth and   genitals (private parts).                       Wash face,  Genitals (private parts) with your normal soap.              6.  Wash thoroughly, paying special attention to the area where your    surgery  will be performed.  7.  Thoroughly rinse your body with warm water from the neck down.  8.  DO NOT shower/wash with your normal soap after using and rinsing off the CHG Soap.                9.  Pat yourself dry with a clean towel.            10.  Wear clean pajamas.            11.  Place clean sheets on your bed the night of your first shower and do not  sleep with pets. Day of Surgery : Do not apply any lotions/deodorants the morning of surgery.  Please wear clean clothes to the hospital/surgery center.  FAILURE TO FOLLOW THESE INSTRUCTIONS MAY RESULT IN THE CANCELLATION OF YOUR SURGERY  PATIENT SIGNATURE_________________________________  NURSE SIGNATURE__________________________________  ________________________________________________________________________   Incentive  Spirometer  An incentive spirometer is a tool that can help keep your lungs clear and active. This tool measures how well you are filling your lungs with each breath. Taking long deep breaths may help reverse or decrease the chance of developing breathing (pulmonary) problems (especially infection) following: A long period of time when you are unable to move or be active. BEFORE THE PROCEDURE  If the spirometer includes an indicator to show your best effort, your nurse or respiratory therapist will set it to a desired goal. If possible, sit up straight or lean slightly forward. Try not to slouch. Hold the incentive spirometer in an upright position. INSTRUCTIONS FOR USE  Sit on the edge of your bed if possible, or sit up as far as you can in bed or on a chair. Hold the incentive spirometer in an upright position. Breathe out normally. Place the mouthpiece in your mouth and seal your lips tightly around it. Breathe in slowly and as deeply as possible, raising the piston or the ball toward the top of the column. Hold your breath for  3-5 seconds or for as long as possible. Allow the piston or ball to fall to the bottom of the column. Remove the mouthpiece from your mouth and breathe out normally. Rest for a few seconds and repeat Steps 1 through 7 at least 10 times every 1-2 hours when you are awake. Take your time and take a few normal breaths between deep breaths. The spirometer may include an indicator to show your best effort. Use the indicator as a goal to work toward during each repetition. After each set of 10 deep breaths, practice coughing to be sure your lungs are clear. If you have an incision (the cut made at the time of surgery), support your incision when coughing by placing a pillow or rolled up towels firmly against it. Once you are able to get out of bed, walk around indoors and cough well. You may stop using the incentive spirometer when instructed by your caregiver.  RISKS AND COMPLICATIONS Take your time so you do not get dizzy or light-headed. If you are in pain, you may need to take or ask for pain medication before doing incentive spirometry. It is harder to take a deep breath if you are having pain. AFTER USE Rest and breathe slowly and easily. It can be helpful to keep track of a log of your progress. Your caregiver can provide you with a simple table to help with this. If you are using the spirometer at home, follow these instructions: [place] IF:  You are having difficultly using the spirometer. You have trouble using the spirometer as often as instructed. Your pain medication is not giving enough relief while using the spirometer. You develop fever of 100.5 F (38.1 C) or higher. SEEK IMMEDIATE MEDICAL CARE IF:  You cough up bloody sputum that had not been present before. You develop fever of 102 F (38.9 C) or greater. You develop worsening pain at or near the incision site. MAKE SURE YOU:  Understand these instructions. Will watch your condition. Will get help right away if you  are not doing well or get worse. Document Released: 09/22/2006 Document Revised: 08/04/2011 Document Reviewed: 11/23/2006 Exit[employer] Patient Information 2014 [employer], [place].   ________________________________________________________________________    WHAT IS A BLOOD TRANSFUSION? Blood Transfusion Information  A transfusion is the replacement of blood or some of its parts. Blood is made up of multiple cells which provide different functions. Red  blood cells carry oxygen and are used for blood loss replacement. White blood cells fight against infection. Platelets control bleeding. Plasma helps clot blood. Other blood products are available for specialized needs, such as hemophilia or other clotting disorders. BEFORE THE TRANSFUSION  Who gives blood for transfusions?  Healthy volunteers who are fully evaluated to make sure their blood is safe. This is blood bank blood. Transfusion therapy is the safest it has ever been in the practice of medicine. Before blood is taken from a donor, a complete history is taken to make sure that person has no history of diseases nor engages in risky social behavior (examples are intravenous drug use or sexual activity with multiple partners). The donor's travel history is screened to minimize risk of transmitting infections, such as malaria. The donated blood is tested for signs of infectious diseases, such as HIV and hepatitis. The blood is then tested to be sure it is compatible with you in order to minimize the chance of a transfusion reaction. If you or a relative donates blood, this is often done in anticipation of surgery and is not appropriate for emergency situations. It takes many days to process the donated blood. RISKS AND COMPLICATIONS Although transfusion therapy is very safe and saves many lives, the main dangers of transfusion include:  Getting an infectious disease. Developing a transfusion reaction. This is an allergic reaction to something in the  blood you were given. Every precaution is taken to prevent this. The decision to have a blood transfusion has been considered carefully by your caregiver before blood is given. Blood is not given unless the benefits outweigh the risks. AFTER THE TRANSFUSION Right after receiving a blood transfusion, you will usually feel much better and more energetic. This is especially true if your red blood cells have gotten low (anemic). The transfusion raises the level of the red blood cells which carry oxygen, and this usually causes an energy increase. The nurse administering the transfusion will monitor you carefully for complications. HOME CARE INSTRUCTIONS  No special instructions are needed after a transfusion. You may find your energy is better. Speak with your caregiver about any limitations on activity for underlying diseases you may have. SEEK MEDICAL CARE IF:  Your condition is not improving after your transfusion. You develop redness or irritation at the intravenous (IV) site. SEEK IMMEDIATE MEDICAL CARE IF:  Any of the following symptoms occur over the next 12 hours: Shaking chills. You have a temperature by mouth above 102 F (38.9 C), not controlled by medicine. Chest, back, or muscle pain. People around you feel you are not acting correctly or are confused. Shortness of breath or difficulty breathing. Dizziness and fainting. You get a rash or develop hives. You have a decrease in urine output. Your urine turns a dark color or changes to pink, red, or brown. Any of the following symptoms occur over the next 10 days: You have a temperature by mouth above 102 F (38.9 C), not controlled by medicine. Shortness of breath. Weakness after normal activity. The white part of the eye turns yellow (jaundice). You have a decrease in the amount of urine or are urinating less often. Your urine turns a dark color or changes to pink, red, or brown. Document Released: 05/09/2000 Document Revised:  08/04/2011 Document Reviewed: 12/27/2007 [facility] Patient Information 2014 [place], [place].  _______________________________________________________________________

## 2021-06-14 NOTE — Progress Notes (Addendum)
COVID swab appointment: 06-28-21 @ 8:15  COVID Vaccine Completed:  Yes x2 Date COVID Vaccine completed: 08-17-19 09-14-19 Has received booster: COVID vaccine manufacturer:   Moderna     Date of COVID positive in last 90 days: No  PCP - Ernestene Kiel, MD Cardiologist - N/A  Chest x-ray - 03-12-21 Epic EKG - 03-12-21 Epic Stress Test - greater than 20 years ECHO - N/A Cardiac Cath - N/A Pacemaker/ICD device last checked: Spinal Cord Stimulator:  Sleep Study - N/A CPAP -   Fasting Blood Sugar - N/A Checks Blood Sugar _____ times a day  Blood Thinner Instructions:  N/A Aspirin Instructions: Last Dose:  Activity level:   Can go up a flight of stairs and perform activities of daily living without stopping and without symptoms of chest pain or shortness of breath.    Anesthesia review: N/A  Patient denies shortness of breath, fever, cough and chest pain at PAT appointment   Patient verbalized understanding of instructions that were given to them at the PAT appointment. Patient was also instructed that they will need to review over the PAT instructions again at home before surgery.

## 2021-06-17 ENCOUNTER — Encounter: Payer: BC Managed Care – PPO | Attending: Surgery | Admitting: Skilled Nursing Facility1

## 2021-06-17 ENCOUNTER — Other Ambulatory Visit: Payer: Self-pay

## 2021-06-17 DIAGNOSIS — E669 Obesity, unspecified: Secondary | ICD-10-CM | POA: Diagnosis not present

## 2021-06-17 NOTE — Progress Notes (Signed)
Pre-Operative Nutrition Class:    Patient was seen on 06/17/2021 for Pre-Operative Bariatric Surgery Education at the [facility].    Surgery date: 07/02/2021 Surgery type: sleeve Start weight at [facility]: 253.7 pounds Weight today: 249.1 pounds  Samples given per MNT protocol. Patient educated on appropriate usage: Ensure max exp: July 24, 2021 Ensure max lot: [id number]  Chewable bariatric advantage: advanced multi EA exp: 08/23 Chewable bariatric advantage: advanced multi EA lot: [id number]  Bariatric advantage calcium citrate exp: 02/23 Bariatric advantage calcium citrate lot: [id number]   The following the learning objectives were met by the patient during this course: Identify Pre-Op Dietary Goals and will begin 2 weeks pre-operatively Identify appropriate sources of fluids and proteins  State protein recommendations and appropriate sources pre and post-operatively Identify Post-Operative Dietary Goals and will follow for 2 weeks post-operatively Identify appropriate multivitamin and calcium sources Describe the need for physical activity post-operatively and will follow MD recommendations State when to call healthcare provider regarding medication questions or post-operative complications When having a diagnosis of diabetes understanding hypoglycemia symptoms and the inclusion of 1 complex carbohydrate per meal  Handouts given during class include: Pre-Op Bariatric Surgery Diet Handout Protein Shake Handout Post-Op Bariatric Surgery Nutrition Handout BELT Program Information Flyer Support Group Information Flyer [facility] Bariatric Supplements Price List  Follow-Up Plan: Patient will follow-up at [facility] 2 weeks post operatively for diet advancement per MD.

## 2021-06-18 NOTE — Progress Notes (Signed)
Surgery orders requested with Dr. Earlie Server office.

## 2021-06-19 ENCOUNTER — Ambulatory Visit: Payer: Self-pay | Admitting: Surgery

## 2021-06-20 ENCOUNTER — Other Ambulatory Visit: Payer: Self-pay

## 2021-06-20 ENCOUNTER — Encounter (HOSPITAL_COMMUNITY): Payer: Self-pay

## 2021-06-20 ENCOUNTER — Encounter (HOSPITAL_COMMUNITY)
Admission: RE | Admit: 2021-06-20 | Discharge: 2021-06-20 | Disposition: A | Discharge status: Home / Self Care | Payer: BC Managed Care – PPO | Source: Ambulatory Visit | Attending: Surgery | Admitting: Surgery

## 2021-06-20 VITALS — BP 157/94 | HR 74 | Temp 98.4°F | Resp 18 | Ht 67.0 in | Wt 245.2 lb

## 2021-06-20 DIAGNOSIS — Z01812 Encounter for preprocedural laboratory examination: Secondary | ICD-10-CM | POA: Insufficient documentation

## 2021-06-20 DIAGNOSIS — I2510 Atherosclerotic heart disease of native coronary artery without angina pectoris: Secondary | ICD-10-CM | POA: Insufficient documentation

## 2021-06-20 DIAGNOSIS — Z01818 Encounter for other preprocedural examination: Secondary | ICD-10-CM

## 2021-06-20 LAB — BASIC METABOLIC PANEL
Anion gap: 9 (ref 5–15)
BUN: 18 mg/dL (ref 6–20)
CO2: 29 mmol/L (ref 22–32)
Calcium: 8.9 mg/dL (ref 8.9–10.3)
Chloride: 100 mmol/L (ref 98–111)
Creatinine, Ser: 0.72 mg/dL (ref 0.44–1.00)
GFR, Estimated: >60 mL/min (ref >60)
Glucose, Bld: 88 mg/dL (ref 70–99)
Potassium: 3.5 mmol/L (ref 3.5–5.1)
Sodium: 138 mmol/L (ref 135–145)

## 2021-06-20 LAB — CBC
HCT: 39.9 % (ref 36.0–46.0)
Hemoglobin: 13.6 g/dL (ref 12.0–15.0)
MCH: 29.6 pg (ref 26.0–34.0)
MCHC: 34.1 g/dL (ref 30.0–36.0)
MCV: 86.7 fL (ref 80.0–100.0)
Platelets: 368 10*3/uL (ref 150–400)
RBC: 4.6 MIL/uL (ref 3.87–5.11)
RDW: 13.2 % (ref 11.5–15.5)
WBC: 7.9 10*3/uL (ref 4.0–10.5)
nRBC: 0 % (ref 0.0–0.2)

## 2021-06-20 LAB — TYPE AND SCREEN
ABO/RH(D): O POS
Antibody Screen: NEGATIVE

## 2021-06-26 DIAGNOSIS — I10 Essential (primary) hypertension: Secondary | ICD-10-CM | POA: Diagnosis not present

## 2021-06-26 NOTE — H&P (Signed)
Chief Complaint: New Consultation (Discuss sleeve)   History of Present Illness: Rhonda Roberson is a 61 y.o. female who is seen today as an office consultation at the request of Dr. Pasty Arch for evaluation of New Consultation (Discuss sleeve) .   She has been to our seminar as well and she is interesting have a sleeve gastrectomy. She came with her husband Rhonda Roberson. Her BMI is 40 with a height of 5.6 and a weight of 252. We discussed sleeve gastrectomy in some detail as well and she to is done her homework. She wants to proceed with sleeve gastrectomy.  She was seen in the office on 2/1 for her preop visit.  Questions answered.   Review of Systems: See HPI as well for other ROS.  ROS   Medical History: Past Medical History:  Diagnosis Date   Hypertension   Patient Active Problem List  Diagnosis   History of abnormal cervical Papanicolaou smear   Increased body mass index   Malignant melanoma of skin of left leg (CMS-HCC)   Urge incontinence of urine   History reviewed. No pertinent surgical history.   Allergies  Allergen Reactions   Penicillins Nausea And Vomiting and Rash   Current Outpatient Medications on File Prior to Visit  Medication Sig Dispense Refill   cyanocobalamin (VITAMIN B12) 1000 MCG tablet Take 1,000 mcg by mouth once daily   FUROsemide (LASIX) 40 MG tablet furosemide 40 mg tablet   valsartan-hydrochlorothiazide (DIOVAN-HCT) 80-12.5 mg tablet Take 1 tablet by mouth once daily   vit C/zinc citrate/elderberry (ELDERBERRY IMMUNE HEALTH ORAL) Take by mouth   No current facility-administered medications on file prior to visit.   History reviewed. No pertinent family history.   Social History   Tobacco Use  Smoking Status Never Smoker  Smokeless Tobacco Never Used    Social History   Socioeconomic History   Marital status: Married  Tobacco Use   Smoking status: Never Smoker   Smokeless tobacco: Never Used  Substance and Sexual Activity   Alcohol use: Yes   Comment: socially   Drug use: Never   Objective:   Vitals:  02/20/21 1550  BP: 128/80  Pulse: 93  Temp: 36.8 C (98.3 F)  SpO2: 93%  Weight: (!) 114.5 kg (252 lb 6.4 oz)  Height: 168.3 cm (5' 6.25")   Body mass index is 40.43 kg/m.  Physical Exam General: Well-developed white female no acute distress HEENT : Unremarkable Chest: Clear Heart: Sinus rhythm without murmurs or gallops. No carotid bruits. Breast: Not examined Abdomen: Nontender GU not examined Rectal not examined Extremities full range of motion Neuro alert and oriented x3. Motor and sensory function grossly intact.  Labs, Imaging and Diagnostic Testing: No labs to review  Assessment and Plan:  Diagnoses and all orders for this visit:  Morbid (severe) obesity due to excess calories (CMS-HCC)    BMI 40 with hypertension. History of melanoma of her leg. She and her husband are both been the seminars and discussed bariatric surgery. I think she would be a good candidate. We will submit her records for approval for sleeve gastrectomy.   Rhonda Roberson Donia Pounds, MD

## 2021-06-28 ENCOUNTER — Encounter (HOSPITAL_COMMUNITY)
Admission: RE | Admit: 2021-06-28 | Discharge: 2021-06-28 | Disposition: A | Discharge status: Home / Self Care | Payer: BC Managed Care – PPO | Source: Ambulatory Visit | Attending: Surgery | Admitting: Surgery

## 2021-06-28 ENCOUNTER — Other Ambulatory Visit: Payer: Self-pay

## 2021-06-28 DIAGNOSIS — Z20822 Contact with and (suspected) exposure to covid-19: Secondary | ICD-10-CM | POA: Insufficient documentation

## 2021-06-28 DIAGNOSIS — Z01812 Encounter for preprocedural laboratory examination: Secondary | ICD-10-CM | POA: Diagnosis not present

## 2021-06-28 LAB — SARS CORONAVIRUS 2 (TAT 6-24 HRS): SARS Coronavirus 2: NEGATIVE

## 2021-07-02 ENCOUNTER — Encounter (HOSPITAL_COMMUNITY)
Admission: RE | Disposition: A | Discharge status: Home / Self Care | Payer: Self-pay | Source: Home / Self Care | Attending: Surgery

## 2021-07-02 ENCOUNTER — Other Ambulatory Visit: Payer: Self-pay

## 2021-07-02 ENCOUNTER — Inpatient Hospital Stay (HOSPITAL_COMMUNITY): Payer: BC Managed Care – PPO | Admitting: Certified Registered Nurse Anesthetist

## 2021-07-02 ENCOUNTER — Encounter (HOSPITAL_COMMUNITY): Payer: Self-pay | Admitting: Surgery

## 2021-07-02 ENCOUNTER — Inpatient Hospital Stay (HOSPITAL_COMMUNITY)
Admission: RE | Admit: 2021-07-02 | Discharge: 2021-07-03 | DRG: 621 | Disposition: A | Discharge status: Home / Self Care | Payer: BC Managed Care – PPO | Attending: Surgery | Admitting: Surgery

## 2021-07-02 DIAGNOSIS — I10 Essential (primary) hypertension: Secondary | ICD-10-CM | POA: Diagnosis not present

## 2021-07-02 DIAGNOSIS — Z6841 Body Mass Index (BMI) 40.0 and over, adult: Secondary | ICD-10-CM

## 2021-07-02 DIAGNOSIS — Z85820 Personal history of malignant melanoma of skin: Secondary | ICD-10-CM

## 2021-07-02 DIAGNOSIS — Z903 Acquired absence of stomach [part of]: Secondary | ICD-10-CM

## 2021-07-02 DIAGNOSIS — Z880 Allergy status to penicillin: Secondary | ICD-10-CM | POA: Diagnosis not present

## 2021-07-02 DIAGNOSIS — N3941 Urge incontinence: Secondary | ICD-10-CM | POA: Diagnosis not present

## 2021-07-02 DIAGNOSIS — Z6838 Body mass index (BMI) 38.0-38.9, adult: Secondary | ICD-10-CM | POA: Diagnosis not present

## 2021-07-02 DIAGNOSIS — Z79899 Other long term (current) drug therapy: Secondary | ICD-10-CM

## 2021-07-02 HISTORY — PX: UPPER GI ENDOSCOPY: SHX6162

## 2021-07-02 LAB — COMPREHENSIVE METABOLIC PANEL
ALT: 39 U/L (ref 0–44)
AST: 26 U/L (ref 15–41)
Albumin: 4 g/dL (ref 3.5–5.0)
Alkaline Phosphatase: 80 U/L (ref 38–126)
Anion gap: 10 (ref 5–15)
BUN: 16 mg/dL (ref 6–20)
CO2: 26 mmol/L (ref 22–32)
Calcium: 8.1 mg/dL — ABNORMAL LOW (ref 8.9–10.3)
Chloride: 102 mmol/L (ref 98–111)
Creatinine, Ser: 0.52 mg/dL (ref 0.44–1.00)
GFR, Estimated: >60 mL/min (ref >60)
Glucose, Bld: 92 mg/dL (ref 70–99)
Potassium: 3.4 mmol/L — ABNORMAL LOW (ref 3.5–5.1)
Sodium: 138 mmol/L (ref 135–145)
Total Bilirubin: 0.4 mg/dL (ref 0.3–1.2)
Total Protein: 6.9 g/dL (ref 6.5–8.1)

## 2021-07-02 LAB — CBC
HCT: 38.6 % (ref 36.0–46.0)
Hemoglobin: 13.1 g/dL (ref 12.0–15.0)
MCH: 29.5 pg (ref 26.0–34.0)
MCHC: 33.9 g/dL (ref 30.0–36.0)
MCV: 86.9 fL (ref 80.0–100.0)
Platelets: 338 10*3/uL (ref 150–400)
RBC: 4.44 MIL/uL (ref 3.87–5.11)
RDW: 13.2 % (ref 11.5–15.5)
WBC: 7.3 10*3/uL (ref 4.0–10.5)
nRBC: 0 % (ref 0.0–0.2)

## 2021-07-02 LAB — CREATININE, SERUM
Creatinine, Ser: 0.77 mg/dL (ref 0.44–1.00)
GFR, Estimated: >60 mL/min (ref >60)

## 2021-07-02 LAB — ABO/RH: ABO/RH(D): O POS

## 2021-07-02 SURGERY — XI ROBOTIC GASTRIC SLEEVE RESECTION
Anesthesia: General | Site: Abdomen

## 2021-07-02 MED ORDER — LIDOCAINE HCL (PF) 2 % IJ SOLN
INTRAMUSCULAR | Status: AC
  Filled 2021-07-02: qty 5

## 2021-07-02 MED ORDER — HYDROMORPHONE HCL 1 MG/ML IJ SOLN
INTRAMUSCULAR | Status: AC
  Filled 2021-07-02: qty 1

## 2021-07-02 MED ORDER — MIDAZOLAM HCL 2 MG/2ML IJ SOLN
INTRAMUSCULAR | Status: AC
  Filled 2021-07-02: qty 2

## 2021-07-02 MED ORDER — LIDOCAINE HCL (PF) 2 % IJ SOLN
INTRAMUSCULAR | Status: DC | PRN
  Administered 2021-07-02: 1.5 mg/kg/h via INTRADERMAL

## 2021-07-02 MED ORDER — CHLORHEXIDINE GLUCONATE CLOTH 2 % EX PADS: 6.0000 | MEDICATED_PAD | Freq: Once | CUTANEOUS | Status: DC

## 2021-07-02 MED ORDER — SODIUM CHLORIDE (PF) 0.9 % IJ SOLN
INTRAMUSCULAR | Status: AC
  Filled 2021-07-02: qty 10

## 2021-07-02 MED ORDER — MEPERIDINE HCL 50 MG/ML IJ SOLN: 6.2500 mg | INTRAMUSCULAR | Status: DC | PRN

## 2021-07-02 MED ORDER — BUPIVACAINE LIPOSOME 1.3 % IJ SUSP
INTRAMUSCULAR | Status: DC | PRN
  Administered 2021-07-02: 20 mL

## 2021-07-02 MED ORDER — FENTANYL CITRATE (PF) 100 MCG/2ML IJ SOLN
INTRAMUSCULAR | Status: DC | PRN
Start: 2021-07-02 — End: 2021-07-02
  Administered 2021-07-02: 100 ug via INTRAVENOUS
  Administered 2021-07-02: 50 ug via INTRAVENOUS

## 2021-07-02 MED ORDER — ONDANSETRON HCL 4 MG/2ML IJ SOLN: 4.0000 mg | INTRAMUSCULAR | Status: DC | PRN

## 2021-07-02 MED ORDER — DEXAMETHASONE SODIUM PHOSPHATE 10 MG/ML IJ SOLN
INTRAMUSCULAR | Status: AC
  Filled 2021-07-02: qty 1

## 2021-07-02 MED ORDER — DEXAMETHASONE SODIUM PHOSPHATE 4 MG/ML IJ SOLN
INTRAMUSCULAR | Status: DC | PRN
  Administered 2021-07-02: 10 mg via INTRAVENOUS

## 2021-07-02 MED ORDER — SCOPOLAMINE 1 MG/3DAYS TD PT72
1.0000 | MEDICATED_PATCH | TRANSDERMAL | Status: DC
  Administered 2021-07-02: 1.5 mg via TRANSDERMAL
  Filled 2021-07-02: qty 1

## 2021-07-02 MED ORDER — PHENYLEPHRINE HCL-NACL 20-0.9 MG/250ML-% IV SOLN
INTRAVENOUS | Status: AC
  Filled 2021-07-02: qty 250

## 2021-07-02 MED ORDER — ENSURE MAX PROTEIN PO LIQD
2.0000 [oz_av] | ORAL | Status: DC
  Administered 2021-07-03 (×3): 2 [oz_av] via ORAL

## 2021-07-02 MED ORDER — LACTATED RINGERS IR SOLN
Status: DC | PRN
Start: 2021-07-02 — End: 2021-07-02
  Administered 2021-07-02: 1000 mL

## 2021-07-02 MED ORDER — ROCURONIUM BROMIDE 10 MG/ML (PF) SYRINGE
PREFILLED_SYRINGE | INTRAVENOUS | Status: AC
  Filled 2021-07-02: qty 10

## 2021-07-02 MED ORDER — ACETAMINOPHEN 160 MG/5ML PO SOLN: 1000.0000 mg | Freq: Three times a day (TID) | ORAL | Status: DC

## 2021-07-02 MED ORDER — PANTOPRAZOLE SODIUM 40 MG IV SOLR
40.0000 mg | Freq: Every day | INTRAVENOUS | Status: DC
  Administered 2021-07-02: 40 mg via INTRAVENOUS
  Filled 2021-07-02: qty 10

## 2021-07-02 MED ORDER — BUPIVACAINE LIPOSOME 1.3 % IJ SUSP: 20.0000 mL | Freq: Once | INTRAMUSCULAR | Status: DC

## 2021-07-02 MED ORDER — MORPHINE SULFATE (PF) 2 MG/ML IV SOLN: 1.0000 mg | INTRAVENOUS | Status: DC | PRN

## 2021-07-02 MED ORDER — ORAL CARE MOUTH RINSE: 15.0000 mL | Freq: Once | OROMUCOSAL | Status: AC

## 2021-07-02 MED ORDER — KCL IN DEXTROSE-NACL 20-5-0.45 MEQ/L-%-% IV SOLN
INTRAVENOUS | Status: DC
  Filled 2021-07-02 (×3): qty 1000

## 2021-07-02 MED ORDER — CHLORHEXIDINE GLUCONATE 0.12 % MT SOLN
15.0000 mL | Freq: Once | OROMUCOSAL | Status: AC
  Administered 2021-07-02: 15 mL via OROMUCOSAL

## 2021-07-02 MED ORDER — SODIUM CHLORIDE 0.9 % IV SOLN
2.0000 g | INTRAVENOUS | Status: AC
  Administered 2021-07-02: 2 g via INTRAVENOUS
  Filled 2021-07-02: qty 2

## 2021-07-02 MED ORDER — ACETAMINOPHEN 500 MG PO TABS
1000.0000 mg | ORAL_TABLET | Freq: Three times a day (TID) | ORAL | Status: DC
  Administered 2021-07-02 – 2021-07-03 (×2): 1000 mg via ORAL
  Filled 2021-07-02 (×2): qty 2

## 2021-07-02 MED ORDER — METOPROLOL TARTRATE 5 MG/5ML IV SOLN: 5.0000 mg | Freq: Four times a day (QID) | INTRAVENOUS | Status: DC | PRN

## 2021-07-02 MED ORDER — KETOROLAC TROMETHAMINE 30 MG/ML IJ SOLN
INTRAMUSCULAR | Status: AC
  Filled 2021-07-02: qty 1

## 2021-07-02 MED ORDER — FENTANYL CITRATE (PF) 100 MCG/2ML IJ SOLN
INTRAMUSCULAR | Status: AC
  Filled 2021-07-02: qty 2

## 2021-07-02 MED ORDER — HYDROMORPHONE HCL 1 MG/ML IJ SOLN
0.2500 mg | INTRAMUSCULAR | Status: DC | PRN
  Administered 2021-07-02 (×2): 0.5 mg via INTRAVENOUS

## 2021-07-02 MED ORDER — PHENYLEPHRINE HCL-NACL 20-0.9 MG/250ML-% IV SOLN
INTRAVENOUS | Status: DC | PRN
  Administered 2021-07-02: 10 ug/min via INTRAVENOUS

## 2021-07-02 MED ORDER — ROCURONIUM BROMIDE 10 MG/ML (PF) SYRINGE
PREFILLED_SYRINGE | INTRAVENOUS | Status: DC | PRN
  Administered 2021-07-02: 70 mg via INTRAVENOUS
  Administered 2021-07-02: 30 mg via INTRAVENOUS

## 2021-07-02 MED ORDER — ONDANSETRON HCL 4 MG/2ML IJ SOLN
INTRAMUSCULAR | Status: DC | PRN
  Administered 2021-07-02: 4 mg via INTRAVENOUS

## 2021-07-02 MED ORDER — PROMETHAZINE HCL 25 MG/ML IJ SOLN: 6.2500 mg | INTRAMUSCULAR | Status: DC | PRN

## 2021-07-02 MED ORDER — LACTATED RINGERS IV SOLN: INTRAVENOUS | Status: DC

## 2021-07-02 MED ORDER — ACETAMINOPHEN 500 MG PO TABS
1000.0000 mg | ORAL_TABLET | ORAL | Status: AC
  Administered 2021-07-02: 1000 mg via ORAL
  Filled 2021-07-02: qty 2

## 2021-07-02 MED ORDER — MIDAZOLAM HCL 5 MG/5ML IJ SOLN
INTRAMUSCULAR | Status: DC | PRN
  Administered 2021-07-02: 2 mg via INTRAVENOUS

## 2021-07-02 MED ORDER — HEPARIN SODIUM (PORCINE) 5000 UNIT/ML IJ SOLN
5000.0000 [IU] | INTRAMUSCULAR | Status: AC
  Administered 2021-07-02: 5000 [IU] via SUBCUTANEOUS
  Filled 2021-07-02: qty 1

## 2021-07-02 MED ORDER — LIDOCAINE HCL 2 % IJ SOLN
INTRAMUSCULAR | Status: AC
  Filled 2021-07-02: qty 20

## 2021-07-02 MED ORDER — SODIUM CHLORIDE (PF) 0.9 % IJ SOLN
INTRAMUSCULAR | Status: DC | PRN
  Administered 2021-07-02: 10 mL

## 2021-07-02 MED ORDER — HEPARIN SODIUM (PORCINE) 5000 UNIT/ML IJ SOLN
5000.0000 [IU] | Freq: Three times a day (TID) | INTRAMUSCULAR | Status: DC
  Administered 2021-07-02 – 2021-07-03 (×2): 5000 [IU] via SUBCUTANEOUS
  Filled 2021-07-02 (×2): qty 1

## 2021-07-02 MED ORDER — SUGAMMADEX SODIUM 500 MG/5ML IV SOLN
INTRAVENOUS | Status: AC
  Filled 2021-07-02: qty 5

## 2021-07-02 MED ORDER — PROPOFOL 10 MG/ML IV BOLUS
INTRAVENOUS | Status: AC
  Filled 2021-07-02: qty 20

## 2021-07-02 MED ORDER — LIDOCAINE 2% (20 MG/ML) 5 ML SYRINGE
INTRAMUSCULAR | Status: DC | PRN
  Administered 2021-07-02: 100 mg via INTRAVENOUS

## 2021-07-02 MED ORDER — APREPITANT 40 MG PO CAPS
40.0000 mg | ORAL_CAPSULE | ORAL | Status: AC
  Administered 2021-07-02: 40 mg via ORAL
  Filled 2021-07-02: qty 1

## 2021-07-02 MED ORDER — SUGAMMADEX SODIUM 200 MG/2ML IV SOLN
INTRAVENOUS | Status: DC | PRN
  Administered 2021-07-02: 300 mg via INTRAVENOUS

## 2021-07-02 MED ORDER — 0.9 % SODIUM CHLORIDE (POUR BTL) OPTIME
TOPICAL | Status: DC | PRN
  Administered 2021-07-02: 1000 mL

## 2021-07-02 MED ORDER — PROPOFOL 10 MG/ML IV BOLUS
INTRAVENOUS | Status: DC | PRN
Start: 2021-07-02 — End: 2021-07-02
  Administered 2021-07-02: 200 mg via INTRAVENOUS

## 2021-07-02 MED ORDER — OXYCODONE HCL 5 MG/5ML PO SOLN: 5.0000 mg | Freq: Four times a day (QID) | ORAL | Status: DC | PRN

## 2021-07-02 MED ORDER — BUPIVACAINE LIPOSOME 1.3 % IJ SUSP
INTRAMUSCULAR | Status: AC
  Filled 2021-07-02: qty 20

## 2021-07-02 MED ORDER — ONDANSETRON HCL 4 MG/2ML IJ SOLN
INTRAMUSCULAR | Status: AC
  Filled 2021-07-02: qty 2

## 2021-07-02 SURGICAL SUPPLY — 71 items
ADH SKN CLS APL DERMABOND .7 (GAUZE/BANDAGES/DRESSINGS) ×2
APL PRP STRL LF DISP 70% ISPRP (MISCELLANEOUS) ×2
APPLIER CLIP 5 13 M/L LIGAMAX5 (MISCELLANEOUS)
APPLIER CLIP ROT 10 11.4 M/L (STAPLE)
APR CLP MED LRG 11.4X10 (STAPLE)
APR CLP MED LRG 5 ANG JAW (MISCELLANEOUS)
BLADE SURG 15 STRL LF DISP TIS (BLADE) ×3 IMPLANT
BLADE SURG 15 STRL SS (BLADE) ×3
CANNULA REDUC XI 12-8 STAPL (CANNULA) ×3
CANNULA REDUCER 12-8 DVNC XI (CANNULA) ×3 IMPLANT
CHLORAPREP W/TINT 26 (MISCELLANEOUS) ×4 IMPLANT
CLIP APPLIE 5 13 M/L LIGAMAX5 (MISCELLANEOUS) IMPLANT
CLIP APPLIE ROT 10 11.4 M/L (STAPLE) IMPLANT
COVER SURGICAL LIGHT HANDLE (MISCELLANEOUS) ×4 IMPLANT
DERMABOND ADVANCED (GAUZE/BANDAGES/DRESSINGS) ×1
DERMABOND ADVANCED .7 DNX12 (GAUZE/BANDAGES/DRESSINGS) ×3 IMPLANT
DRAPE ARM DVNC X/XI (DISPOSABLE) ×12 IMPLANT
DRAPE COLUMN DVNC XI (DISPOSABLE) ×3 IMPLANT
DRAPE DA VINCI XI ARM (DISPOSABLE) ×12
DRAPE DA VINCI XI COLUMN (DISPOSABLE) ×3
ELECT REM PT RETURN 15FT ADLT (MISCELLANEOUS) ×4 IMPLANT
GLOVE SURG ENC MOIS LTX SZ8 (GLOVE) ×8 IMPLANT
GOWN STRL REUS W/TWL XL LVL3 (GOWN DISPOSABLE) ×12 IMPLANT
GRASPER SUT TROCAR 14GX15 (MISCELLANEOUS) ×4 IMPLANT
IRRIG SUCT STRYKERFLOW 2 WTIP (MISCELLANEOUS) ×3
IRRIGATION SUCT STRKRFLW 2 WTP (MISCELLANEOUS) ×3 IMPLANT
KIT BASIN OR (CUSTOM PROCEDURE TRAY) ×4 IMPLANT
KIT TURNOVER KIT A (KITS) ×1 IMPLANT
LUBRICANT JELLY K Y 4OZ (MISCELLANEOUS) IMPLANT
MARKER SKIN DUAL TIP RULER LAB (MISCELLANEOUS) ×4 IMPLANT
MAT PREVALON FULL STRYKER (MISCELLANEOUS) ×4 IMPLANT
NDL SPNL 22GX3.5 QUINCKE BK (NEEDLE) ×3 IMPLANT
NEEDLE SPNL 22GX3.5 QUINCKE BK (NEEDLE) ×3 IMPLANT
OBTURATOR OPTICAL STANDARD 8MM (TROCAR) ×3
OBTURATOR OPTICAL STND 8 DVNC (TROCAR) ×2
OBTURATOR OPTICALSTD 8 DVNC (TROCAR) ×3 IMPLANT
PACK CARDIOVASCULAR III (CUSTOM PROCEDURE TRAY) ×4 IMPLANT
RELOAD STAPLE 60 2.5 WHT DVNC (STAPLE) IMPLANT
RELOAD STAPLE 60 3.5 BLU DVNC (STAPLE) IMPLANT
RELOAD STAPLER 2.5X60 WHT DVNC (STAPLE) ×10 IMPLANT
RELOAD STAPLER 3.5X60 BLU DVNC (STAPLE) ×2 IMPLANT
SCISSORS LAP 5X35 DISP (ENDOMECHANICALS) IMPLANT
SEAL CANN UNIV 5-8 DVNC XI (MISCELLANEOUS) ×9 IMPLANT
SEAL XI 5MM-8MM UNIVERSAL (MISCELLANEOUS) ×9
SEALER VESSEL DA VINCI XI (MISCELLANEOUS) ×3
SEALER VESSEL EXT DVNC XI (MISCELLANEOUS) ×3 IMPLANT
SLEEVE GASTRECTOMY 36FR VISIGI (MISCELLANEOUS) ×4 IMPLANT
SOL ANTI FOG 6CC (MISCELLANEOUS) ×3 IMPLANT
SOLUTION ANTI FOG 6CC (MISCELLANEOUS) ×1
SOLUTION ELECTROLUBE (MISCELLANEOUS) ×4 IMPLANT
SPIKE FLUID TRANSFER (MISCELLANEOUS) ×3 IMPLANT
SPONGE T-LAP 18X18 ~~LOC~~+RFID (SPONGE) ×4 IMPLANT
STAPLER 60 DA VINCI SURE FORM (STAPLE) ×3
STAPLER 60 SUREFORM DVNC (STAPLE) ×3 IMPLANT
STAPLER CANNULA SEAL DVNC XI (STAPLE) ×3 IMPLANT
STAPLER CANNULA SEAL XI (STAPLE) ×3
STAPLER RELOAD 2.5X60 WHITE (STAPLE) ×15
STAPLER RELOAD 2.5X60 WHT DVNC (STAPLE) ×10
STAPLER RELOAD 3.5X60 BLU DVNC (STAPLE) ×2
STAPLER RELOAD 3.5X60 BLUE (STAPLE) ×3
SUT ETHIBOND 0 36 GRN (SUTURE) IMPLANT
SUT MNCRL AB 4-0 PS2 18 (SUTURE) ×8 IMPLANT
SUT VICRYL 0 TIES 12 18 (SUTURE) ×4 IMPLANT
SYR 10ML ECCENTRIC (SYRINGE) IMPLANT
SYR 20ML LL LF (SYRINGE) ×4 IMPLANT
TOWEL OR 17X26 10 PK STRL BLUE (TOWEL DISPOSABLE) ×4 IMPLANT
TRAY FOLEY MTR SLVR 16FR STAT (SET/KITS/TRAYS/PACK) IMPLANT
TROCAR ADV FIXATION 5X100MM (TROCAR) IMPLANT
TROCAR BLADELESS OPT 5 100 (ENDOMECHANICALS) ×4 IMPLANT
TUBE CALIBRATION LAPBAND (TUBING) IMPLANT
TUBING INSUFFLATION 10FT LAP (TUBING) ×4 IMPLANT

## 2021-07-02 NOTE — Op Note (Signed)
° °  Surgeon: Kaylyn Lim, MD, FACS  Asst:  Louanna Raw, MD   02 July 2021 Anes:  General endotracheal  Procedure: Robotic sleeve gastrectomy and upper endoscopy  Diagnosis: Morbid obesity  Complications: none  EBL:   minimal cc  Description of Procedure:  The patient was take to OR 2 and given general anesthesia.  The abdomen was prepped with Chloroprep and draped sterilely.  A timeout was performed.  Access to the abdomen was achieved with a 5 mm Optiview through the left upper quadrant.  Following insufflation, the state of the abdomen was found to be free of adhesions.  The ViSiGi 36Fr tube was inserted to deflate the stomach and was pulled back into the esophagus.  Four trocars were placed including a 12 mm for the robotic stapler.    The pylorus was identified and we measured 5 cm back and marked the antrum.  At that point we began dissection to take down the greater curvature of the stomach using the vessel sealer.  This dissection was taken all the way up to the left crus.  Posterior attachments of the stomach were also taken down.    The ViSiGi tube was then passed into the antrum and suction applied so that it was snug along the lessor curvature.  The "crow's foot" or incisura was identified.  The sleeve gastrectomy was begun using the Sureform platform stapler beginning with a blue load and completed with white loads.  Some oozing along the first staple line was controlled with clips.  When the sleeve was complete the tube was taken off suction and insufflated briefly.  The tube was withdrawn.  Upper endoscopy was then performed by Dr. Hassell Done which showed a good cylinder and free flow to the antrum.     The specimen was extracted through the 15 trocar site.  This was closed with a PMI and 0 vicryl.   Local block was provided by infiltrating abdomen as a TAP block and then closed 4-0 Monocryl and Dermabond.    Matt B. Hassell Done, [place], [facility],  [place]

## 2021-07-02 NOTE — Interval H&P Note (Signed)
History and Physical Interval Note:  07/02/2021 1:00 PM  Rhonda Roberson  has presented today for surgery, with the diagnosis of morbid obesity.  The various methods of treatment have been discussed with the patient and family. After consideration of risks, benefits and other options for treatment, the patient has consented to  Procedure(s): XI ROBOTIC GASTRIC SLEEVE RESECTION (N/A) UPPER GI ENDOSCOPY (N/A) as a surgical intervention.  The patient's history has been reviewed, patient examined, no change in status, stable for surgery.  I have reviewed the patient's chart and labs.  Questions were answered to the patient's satisfaction.     Pedro Earls

## 2021-07-02 NOTE — Anesthesia Procedure Notes (Signed)
Procedure Name: Intubation Date/Time: 07/02/2021 1:58 PM Performed by: Claudia Desanctis, CRNA Pre-anesthesia Checklist: Emergency Drugs available, Suction available, Patient identified and Patient being monitored Patient Re-evaluated:Patient Re-evaluated prior to induction Oxygen Delivery Method: Circle system utilized Preoxygenation: Pre-oxygenation with 100% oxygen Induction Type: IV induction Ventilation: Mask ventilation without difficulty Laryngoscope Size: Glidescope and 4 Grade View: Grade I Tube type: Oral Tube size: 7.0 mm Number of attempts: 1 Airway Equipment and Method: Video-laryngoscopy Placement Confirmation: ETT inserted through vocal cords under direct vision, positive ETCO2 and breath sounds checked- equal and bilateral Secured at: 22 cm Tube secured with: Tape Dental Injury: Teeth and Oropharynx as per pre-operative assessment  Difficulty Due To: Difficulty was unanticipated, Difficult Airway- due to anterior larynx and Difficult Airway- due to dentition Comments: Dl times 1 with miller 2    grade 3 view only     view with glidescope was grade 1 but still anterior

## 2021-07-02 NOTE — Anesthesia Postprocedure Evaluation (Signed)
Anesthesia Post Note  Patient: Rhonda Roberson  Procedure(s) Performed: XI ROBOTIC GASTRIC SLEEVE RESECTION (Abdomen) UPPER GI ENDOSCOPY     Patient location during evaluation: PACU Anesthesia Type: General Level of consciousness: sedated and patient cooperative Pain management: pain level controlled Vital Signs Assessment: post-procedure vital signs reviewed and stable Respiratory status: spontaneous breathing Cardiovascular status: stable Anesthetic complications: no   No notable events documented.  Last Vitals:  Vitals:   07/02/21 1645 07/02/21 1710  BP: (!) 145/76 (!) 161/92  Pulse: 65 74  Resp: 14 17  Temp: 36.8 C (!) 36.3 C  SpO2: 95% 100%    Last Pain:  Vitals:   07/02/21 1710  TempSrc: Oral  PainSc: [place]

## 2021-07-02 NOTE — Progress Notes (Signed)
PHARMACY CONSULT FOR:  Risk Assessment for Post-Discharge VTE Following Bariatric Surgery  Post-Discharge VTE Risk Assessment: This patient's probability of 30-day post-discharge VTE is increased due to the factors marked: X Sleeve gastrectomy   Liver disorder (transplant, cirrhosis, or nonalcoholic steatohepatitis)   Hx of VTE   Hemorrhage requiring transfusion   GI perforation, leak, or obstruction   ====================================================    Female  X  Age >/=60 years    BMI >/=50 kg/m2    CHF    Dyspnea at Rest    Paraplegia  X  Non-gastric-band surgery    Operation Time >/=3 hr    Return to OR     Length of Stay >/= 3 d   Hypercoagulable condition   Significant venous stasis      Predicted probability of 30-day post-discharge VTE: 0.31%  Recommendation for Discharge: No pharmacologic prophylaxis post-discharge    Rhonda Roberson is a 61 y.o. female who underwent  robotic sleeve gastrectomy and upper endoscopy on 07/02/21   Case start: 1418 Case end: 1529   Allergies  Allergen Reactions   Penicillins Rash    Patient Measurements: Height: 5\' 7"  (170.2 cm) Weight: 110.3 kg (243 lb 2 oz) IBW/kg (Calculated) : 61.6 Body mass index is 38.08 kg/m.  Recent Labs    07/02/21 1200  CREATININE 0.52  ALBUMIN 4.0  PROT 6.9  AST 26  ALT 39  ALKPHOS 80  BILITOT 0.4   Estimated Creatinine Clearance: 95.7 mL/min (by C-G formula based on SCr of 0.52 mg/dL).    Past Medical History:  Diagnosis Date   Cancer ([place])    melanoma   Hypertension      Medications Prior to Admission  Medication Sig Dispense Refill Last Dose   Cyanocobalamin (B-12) 2500 MCG TABS Take 2,500 mcg by mouth daily.   07/01/2021   ELDERBERRY PO Take 2 each by mouth daily.   07/01/2021   furosemide (LASIX) 40 MG tablet Take 40 mg by mouth daily.  7 07/01/2021   valsartan-hydrochlorothiazide (DIOVAN-HCT) 80-12.5 MG tablet Take 1 tablet by mouth daily.   07/01/2021       Dia Sitter  P 07/02/2021,3:36 PM

## 2021-07-02 NOTE — Transfer of Care (Signed)
Immediate Anesthesia Transfer of Care Note  Patient: Rhonda Roberson  Procedure(s) Performed: XI ROBOTIC GASTRIC SLEEVE RESECTION (Abdomen) UPPER GI ENDOSCOPY  Patient Location: PACU  Anesthesia Type:General  Level of Consciousness: drowsy and patient cooperative  Airway & Oxygen Therapy: Patient Spontanous Breathing and Patient connected to face mask oxygen  Post-op Assessment: Report given to RN and Post -op Vital signs reviewed and stable  Post vital signs: Reviewed and stable  Last Vitals:  Vitals Value Taken Time  BP 162/82 07/02/21 1540  Temp    Pulse 68 07/02/21 1542  Resp 16 07/02/21 1542  SpO2 100 % 07/02/21 1542  Vitals shown include unvalidated device data.  Last Pain:  Vitals:   07/02/21 1147  TempSrc:   PainSc: 0-No pain         Complications: No notable events documented.

## 2021-07-02 NOTE — Anesthesia Preprocedure Evaluation (Addendum)
Anesthesia Evaluation  Patient identified by MRN, date of birth, ID band Patient awake    Reviewed: Allergy & Precautions, NPO status , Patient's Chart, lab work & pertinent test results  Airway Mallampati: II  TM Distance: >3 FB Neck ROM: Full    Dental  (+) Dental Advisory Given, Teeth Intact   Pulmonary neg pulmonary ROS,    Pulmonary exam normal breath sounds clear to auscultation       Cardiovascular hypertension, Pt. on medications Normal cardiovascular exam Rhythm:Regular Rate:Normal     Neuro/Psych negative neurological ROS  negative psych ROS   GI/Hepatic negative GI ROS, Neg liver ROS,   Endo/Other  Morbid obesity  Renal/GU negative Renal ROS  negative genitourinary   Musculoskeletal negative musculoskeletal ROS (+)   Abdominal (+) + obese,   Peds negative pediatric ROS (+)  Hematology negative hematology ROS (+)   Anesthesia Other Findings   Reproductive/Obstetrics negative OB ROS                            Anesthesia Physical  Anesthesia Plan  ASA: 3  Anesthesia Plan: General   Post-op Pain Management: Tylenol PO (pre-op), Lidocaine infusion and Ketamine IV   Induction: Intravenous  PONV Risk Score and Plan: 4 or greater and Ondansetron, Midazolam, Dexamethasone, Treatment may vary due to age or medical condition, Scopolamine patch - Pre-op and Aprepitant  Airway Management Planned: Simple Face Mask  Additional Equipment:   Intra-op Plan:   Post-operative Plan: Extubation in OR  Informed Consent: I have reviewed the patients History and Physical, chart, labs and discussed the procedure including the risks, benefits and alternatives for the proposed anesthesia with the patient or authorized representative who has indicated his/her understanding and acceptance.     Dental advisory given  Plan Discussed with: CRNA  Anesthesia Plan Comments:         Anesthesia Quick Evaluation

## 2021-07-03 ENCOUNTER — Encounter (HOSPITAL_COMMUNITY): Payer: Self-pay | Admitting: Surgery

## 2021-07-03 LAB — CBC WITH DIFFERENTIAL/PLATELET
Abs Immature Granulocytes: 0.01 10*3/uL (ref 0.00–0.07)
Basophils Absolute: 0 10*3/uL (ref 0.0–0.1)
Basophils Relative: 0 %
Eosinophils Absolute: 0 10*3/uL (ref 0.0–0.5)
Eosinophils Relative: 0 %
HCT: 37.2 % (ref 36.0–46.0)
Hemoglobin: 12.5 g/dL (ref 12.0–15.0)
Immature Granulocytes: 0 %
Lymphocytes Relative: 6 %
Lymphs Abs: 0.3 10*3/uL — ABNORMAL LOW (ref 0.7–4.0)
MCH: 29.3 pg (ref 26.0–34.0)
MCHC: 33.6 g/dL (ref 30.0–36.0)
MCV: 87.1 fL (ref 80.0–100.0)
Monocytes Absolute: 0.1 10*3/uL (ref 0.1–1.0)
Monocytes Relative: 3 %
Neutro Abs: 5.2 10*3/uL (ref 1.7–7.7)
Neutrophils Relative %: 91 %
Platelets: 337 10*3/uL (ref 150–400)
RBC: 4.27 MIL/uL (ref 3.87–5.11)
RDW: 13.2 % (ref 11.5–15.5)
WBC: 5.7 10*3/uL (ref 4.0–10.5)
nRBC: 0 % (ref 0.0–0.2)

## 2021-07-03 MED ORDER — OXYCODONE HCL 5 MG PO TABS: 5.0000 mg | ORAL_TABLET | Freq: Four times a day (QID) | ORAL | 0 refills | Status: AC | PRN

## 2021-07-03 MED ORDER — ONDANSETRON 4 MG PO TBDP: 4.0000 mg | ORAL_TABLET | Freq: Four times a day (QID) | ORAL | 0 refills | Status: AC | PRN

## 2021-07-03 NOTE — Progress Notes (Signed)
Transition of Care [facility]) Screening Note  Patient Details  Name: Rhonda Roberson Date of Birth: [birth year]  Transition of [facility]) CM/SW Contact:    Sherie Don, LCSW Phone Number: 07/03/2021, 10:35 AM  Transition of Care Department [facility]) has reviewed patient and no TOC needs have been identified at this time. We will continue to monitor patient advancement through interdisciplinary progression rounds. If new patient transition needs arise, please place a TOC consult.

## 2021-07-03 NOTE — Progress Notes (Signed)
Patient alert and oriented, pain is controlled. Patient is tolerating fluids, advanced to protein shake today, patient is tolerating well.  Reviewed Gastric sleeve discharge instructions with patient and patient is able to articulate understanding.  Provided information on BELT program, Support Group and [facility] outpatient pharmacy. All questions answered, will continue to monitor.  

## 2021-07-03 NOTE — Discharge Instructions (Signed)

## 2021-07-03 NOTE — Progress Notes (Signed)
24hr fluid recall prior to discharge: 573mL.  Per dehydration protocol, will cal pt to f/u within one week post op.

## 2021-07-03 NOTE — Plan of Care (Signed)
°  Problem: Education: Goal: Ability to state signs and symptoms to report to health care provider will improve Outcome: Adequate for Discharge Goal: Knowledge of the prescribed self-care regimen will improve Outcome: Adequate for Discharge Goal: Knowledge of discharge needs will improve Outcome: Adequate for Discharge   Problem: Activity: Goal: Ability to tolerate increased activity will improve Outcome: Adequate for Discharge   Problem: Bowel/Gastric: Goal: Gastrointestinal status for postoperative course will improve Outcome: Adequate for Discharge Goal: Occurrences of nausea will decrease Outcome: Adequate for Discharge   Problem: Coping: Goal: Development of coping mechanisms to deal with changes in body function or appearance will improve Outcome: Adequate for Discharge   Problem: Fluid Volume: Goal: Maintenance of adequate hydration will improve Outcome: Adequate for Discharge   Problem: Nutritional: Goal: Nutritional status will improve Outcome: Adequate for Discharge   Problem: Clinical Measurements: Goal: Will show no signs or symptoms of venous thromboembolism Outcome: Adequate for Discharge Goal: Will remain free from infection Outcome: Adequate for Discharge Goal: Will show no signs of GI Leak Outcome: Adequate for Discharge   Problem: Respiratory: Goal: Will regain and/or maintain adequate ventilation Outcome: Adequate for Discharge   Problem: Pain Management: Goal: Pain level will decrease Outcome: Adequate for Discharge   Problem: Skin Integrity: Goal: Demonstration of wound healing without infection will improve Outcome: Adequate for Discharge   Problem: Education: Goal: Knowledge of General Education information will improve Description: Including pain rating scale, medication(s)/side effects and non-pharmacologic comfort measures Outcome: Adequate for Discharge   Problem: Health Behavior/Discharge Planning: Goal: Ability to manage  health-related needs will improve Outcome: Adequate for Discharge   Problem: Clinical Measurements: Goal: Ability to maintain clinical measurements within normal limits will improve Outcome: Adequate for Discharge Goal: Will remain free from infection Outcome: Adequate for Discharge Goal: Diagnostic test results will improve Outcome: Adequate for Discharge Goal: Respiratory complications will improve Outcome: Adequate for Discharge Goal: Cardiovascular complication will be avoided Outcome: Adequate for Discharge   Problem: Activity: Goal: Risk for activity intolerance will decrease Outcome: Adequate for Discharge   Problem: Nutrition: Goal: Adequate nutrition will be maintained Outcome: Adequate for Discharge   Problem: Coping: Goal: Level of anxiety will decrease Outcome: Adequate for Discharge   Problem: Elimination: Goal: Will not experience complications related to bowel motility Outcome: Adequate for Discharge Goal: Will not experience complications related to urinary retention Outcome: Adequate for Discharge   Problem: Pain Managment: Goal: General experience of comfort will improve Outcome: Adequate for Discharge   Problem: Safety: Goal: Ability to remain free from injury will improve Outcome: Adequate for Discharge   Problem: Skin Integrity: Goal: Risk for impaired skin integrity will decrease Outcome: Adequate for Discharge   

## 2021-07-03 NOTE — Discharge Summary (Signed)
Physician Discharge Summary  Patient ID: Rhonda Roberson MRN: [id number] DOB/AGE: 61-Jul-1962 17 y.o.  PCP: Ernestene Kiel, MD  Admit date: 07/02/2021 Discharge date: 07/03/2021  Admission Diagnoses:  obesity  Discharge Diagnoses:  same post sleeve  Principal Problem:   Status post sleeve gastrectomy   Surgery:  robotic sleeve gastrectomy  Discharged Condition: improved  Hospital Course:   Had robotic sleeve gastrectomy with endoscopy on Tuesday.  Kept overnight and started liquids which she tolerated and was ready for discharge on Wednesday.    Consults: none  Significant Diagnostic Studies: none    Discharge Exam: Blood pressure (!) 149/82, pulse (!) 57, temperature 97.7 F (36.5 C), resp. rate 16, height 5\' 7"  (1.702 m), weight 110.3 kg, SpO2 95 %. Incisions OK  Disposition: Discharge disposition: 01-Home or Self Care       Discharge Instructions     Ambulate hourly while awake   Complete by: As directed    Call MD for:  difficulty breathing, headache or visual disturbances   Complete by: As directed    Call MD for:  persistant dizziness or light-headedness   Complete by: As directed    Call MD for:  persistant nausea and vomiting   Complete by: As directed    Call MD for:  redness, tenderness, or signs of infection (pain, swelling, redness, odor or green/yellow discharge around incision site)   Complete by: As directed    Call MD for:  severe uncontrolled pain   Complete by: As directed    Call MD for:  temperature >101 F   Complete by: As directed    Diet bariatric full liquid   Complete by: As directed    Incentive spirometry   Complete by: As directed    Perform hourly while awake      Allergies as of 07/03/2021       Reactions   Penicillins Rash        Medication List     TAKE these medications    B-12 2500 MCG Tabs Take 2,500 mcg by mouth daily.   ELDERBERRY PO Take 2 each by mouth daily.   furosemide 40 MG tablet Commonly known  as: LASIX Take 40 mg by mouth daily. Notes to patient: Monitor Blood Pressure Daily and keep a log for primary care physician.  Monitor for symptoms of dehydration.  You may need to make changes to your medications with rapid weight loss.     ondansetron 4 MG disintegrating tablet Commonly known as: ZOFRAN-ODT Take 1 tablet (4 mg total) by mouth every 6 (six) hours as needed for nausea or vomiting.   oxyCODONE 5 MG immediate release tablet Commonly known as: Oxy IR/ROXICODONE Take 1 tablet (5 mg total) by mouth every 6 (six) hours as needed for severe pain.   valsartan-hydrochlorothiazide 80-12.5 MG tablet Commonly known as: DIOVAN-HCT Take 1 tablet by mouth daily. Notes to patient: Monitor Blood Pressure Daily and keep a log for primary care physician.  You may need to make changes to your medications with rapid weight loss.          Follow-up Information     Johnathan Hausen, MD. Go on 07/31/2021.   Specialty: General Surgery Why: at 1:30pm at the [facility].  Please arrive 15 minutes prior to your appointment time.  Thank you. Contact information: [address] [place] [address] [phone]         Surgery, [place]. Go on 08/28/2021.   Specialty: General Surgery  Why: at 9:15am with Dr. Hassell Done.  Please arrive 15 minutes prior to your appointment time.  Thank you. Contact information: [address] [place] [address] [phone]                 Signed: Pedro Earls 07/03/2021, 1:25 PM

## 2021-07-04 LAB — SURGICAL PATHOLOGY

## 2021-07-05 ENCOUNTER — Telehealth (HOSPITAL_COMMUNITY): Payer: Self-pay | Admitting: *Deleted

## 2021-07-06 NOTE — Telephone Encounter (Signed)
1.  Tell me about your pain and pain management? Pt denies any pain.  2.  Let's talk about fluid intake.  How much total fluid are you taking in? Pt states that she is getting in at least 64oz of fluid including protein shakes, bottled water, and broth.mPt instructed to assess status and suggestions daily utilizing Hydration Action Plan on discharge folder and to call [facility] if in the "red zone".   3.  How much protein have you taken in the last 2 days? Pt states she is meeting her goal of 60g of protein each day with the protein shakes.  4.  Have you had nausea?  Tell me about when have experienced nausea and what you did to help? Pt denies nausea.   5.  Has the frequency or color changed with your urine? Pt states that she is urinating "fine" with no changes in frequency or urgency.     6.  Tell me what your incisions look like? "Incisions look fine". Pt denies a fever, chills.  Pt states incisions are not swollen, open, or draining.  Pt encouraged to call [facility] if incisions change.   7.  Have you been passing gas? BM? Pt states that she is having BMs. Last BM 07/05/21.     8.  If a problem or question were to arise who would you call?  Do you know contact numbers for [place], [facility], and [facility]? Pt denies dehydration symptoms.  Pt can describe s/sx of dehydration.  Pt knows to call [facility] for surgical, [facility] for nutrition, and [place] for non-urgent questions or concerns.   9.  How has the walking going? Pt states she is walking around and able to be active without difficulty.   10. Are you still using your incentive spirometer?  If so, how often? Pt states that she is not doing I.S. but has a schedule for getting up to walk. Pt encouraged to use incentive spirometer, at least 10x every hour while awake until she sees the surgeon. 11.  How are your vitamins and calcium going?  How are you taking them? Pt states that she is taking her supplements and vitamins without difficulty.  Reminded patient that the  first 30 days post-operatively are important for successful recovery.  Practice good hand hygiene, wearing a mask when appropriate (since optional in most places), and minimizing exposure to people who live outside of the home, especially if they are exhibiting any respiratory, GI, or illness-like symptoms.

## 2021-07-16 ENCOUNTER — Encounter: Payer: BC Managed Care – PPO | Attending: Surgery | Admitting: Skilled Nursing Facility1

## 2021-07-16 ENCOUNTER — Other Ambulatory Visit: Payer: Self-pay

## 2021-07-16 DIAGNOSIS — E669 Obesity, unspecified: Secondary | ICD-10-CM | POA: Insufficient documentation

## 2021-07-18 NOTE — Progress Notes (Signed)
2 Week Post-Operative Nutrition Class   Patient was seen on 07/16/2021 for Post-Operative Nutrition education at the [facility].    Surgery date: 07/02/2021 Surgery type: sleeve Start weight at [facility]: 253.7 pounds Weight today: 231 pounds Bowel Habits: Every day to every other day no complaints   Body Composition Scale 07/16/2021  Current Body Weight 231  Total Body Fat % 43.1  Visceral Fat 14  Fat-Free Mass % 56.8   Total Body Water % 42.9  Muscle-Mass lbs 31.8  BMI 35.9  Body Fat Displacement          Torso  lbs 61.6         Left Leg  lbs 12.3         Right Leg  lbs 12.3         Left Arm  lbs 6.1         Right Arm   lbs 6.1      The following the learning objectives were met by the patient during this course: Identifies Phase 3 (Soft, High Proteins) Dietary Goals and will begin from 2 weeks post-operatively to 2 months post-operatively Identifies appropriate sources of fluids and proteins  Identifies appropriate fat sources and healthy verses unhealthy fat types   States protein recommendations and appropriate sources post-operatively Identifies the need for appropriate texture modifications, mastication, and bite sizes when consuming solids Identifies appropriate fat consumption and sources Identifies appropriate multivitamin and calcium sources post-operatively Describes the need for physical activity post-operatively and will follow MD recommendations States when to call healthcare provider regarding medication questions or post-operative complications   Handouts given during class include: Phase 3A: Soft, High Protein Diet Handout Phase 3 High Protein Meals Healthy Fats   Follow-Up Plan: Patient will follow-up at [facility] in 6 weeks for 2 month post-op nutrition visit for diet advancement per MD.

## 2021-07-22 ENCOUNTER — Telehealth: Payer: Self-pay | Admitting: Skilled Nursing Facility1

## 2021-07-22 NOTE — Telephone Encounter (Signed)
RD called pt to verify fluid intake once starting soft, solid proteins 2 week post-bariatric surgery.   Daily Fluid intake: 64 oz Daily Protein intake: 60 g Bowel Habits: very day to every other day  Concerns/issues:    None stated

## 2021-07-24 DIAGNOSIS — Z6837 Body mass index (BMI) 37.0-37.9, adult: Secondary | ICD-10-CM | POA: Diagnosis not present

## 2021-07-24 DIAGNOSIS — Z9884 Bariatric surgery status: Secondary | ICD-10-CM | POA: Diagnosis not present

## 2021-07-24 DIAGNOSIS — I10 Essential (primary) hypertension: Secondary | ICD-10-CM | POA: Diagnosis not present

## 2021-08-28 ENCOUNTER — Encounter: Payer: BC Managed Care – PPO | Attending: Surgery | Admitting: Skilled Nursing Facility1

## 2021-08-28 DIAGNOSIS — Z6834 Body mass index (BMI) 34.0-34.9, adult: Secondary | ICD-10-CM | POA: Diagnosis not present

## 2021-08-28 DIAGNOSIS — E669 Obesity, unspecified: Secondary | ICD-10-CM

## 2021-08-28 DIAGNOSIS — Z713 Dietary counseling and surveillance: Secondary | ICD-10-CM | POA: Diagnosis not present

## 2021-08-28 NOTE — Progress Notes (Signed)
Bariatric Nutrition Follow-Up Visit ?Medical Nutrition Therapy  ? ? ?NUTRITION ASSESSMENT ?  ? ?Surgery date: 07/02/2021 ?Surgery type: sleeve ?Start weight at [facility]: 253.7 pounds ?Weight today: 221.5 pounds ? ?  ?Body Composition Scale 07/16/2021 08/28/2021  ?Current Body Weight 231 221.5  ?Total Body Fat % 43.1 42  ?Visceral Fat 14 13  ?Fat-Free Mass % 56.8 57.9  ? Total Body Water % 42.9 43.4  ?Muscle-Mass lbs 31.8 31.7  ?BMI 35.9 34.4  ?Body Fat Displacement    ?       Torso  lbs 61.6 57.6  ?       Left Leg  lbs 12.3 11.5  ?       Right Leg  lbs 12.3 11.5  ?       Left Arm  lbs 6.1 5.7  ?       Right Arm   lbs 6.1 5.7  ? ?Clinical  ?Medical hx: melanoma, HTN ?Medications: see list; b12  ?Labs: HDL 36, LDL 128, calcium 8.6 ?Notable signs/symptoms: N/A ?Any previous deficiencies? No ?  ?Lifestyle & Dietary Hx ? ?Pt states she is going to start doing 5ks again stating her goal is to walk 3.5 miles. ?Pt states she will be going to the [place] on a girls trip upcoming.  ?Pt states she knows she is eating outside of the phases and is good with it: dietitian advised the fast weight loss comes from following the phases and pt states her goals are to be healthy and feel well which trumps the speed of weight loss.  ?Pt states she ordered the baritric plate and is excited to use it.  ? ?Estimated daily fluid intake: 64 oz ?Estimated daily protein intake: 70 g ?Supplements: not taking the multi is taking the calcium  ?Current average weekly physical activity: walking 3 days a week 30 minutes  ? ?24-Hr Dietary Recall ?First Meal: protein shake + fried egg sometimes with bacon  ?Snack:   ?Second Meal: tuna and cheese ?Snack:  peanut butter crackers ?Third Meal: eaten out: chicken, onion, zucchini ?Snack: protein shake  ?Beverages: water + flavoring  ? ?Post-Op Goals/ Signs/ Symptoms ?Using straws: no ?Drinking while eating: no ?Chewing/swallowing difficulties: no ?Changes in vision: no ?Changes to mood/headaches: no ?Hair  loss/changes to skin/nails: no ?Difficulty focusing/concentrating: no ?Sweating: no ?Limb weakness: no ?Dizziness/lightheadedness: no ?Palpitations: no  ?Carbonated/caffeinated beverages: no ?N/V/D/C/Gas: no ?Abdominal pain: no ?Dumping syndrome: no ? ?  ?NUTRITION DIAGNOSIS  ?Overweight/obesity ([place]-3.3) related to past poor dietary habits and physical inactivity as evidenced by completed bariatric surgery and following dietary guidelines for continued weight loss and healthy nutrition status. ?  ?  ?NUTRITION INTERVENTION ?Nutrition counseling (C-1) and education (E-2) to facilitate bariatric surgery goals, including: ?The importance of consuming adequate calories as well as certain nutrients daily due to the body's need for essential vitamins, minerals, and fats ?The importance of daily physical activity and to reach a goal of at least 150 minutes of moderate to vigorous physical activity weekly (or as directed by their physician) due to benefits such as increased musculature and improved lab values ?The importance of intuitive eating specifically learning hunger-satiety cues and understanding the importance of learning a new body: The importance of mindful eating to avoid grazing behaviors  ?Encouraged patient to honor their body's internal hunger and fullness cues.  Throughout the day, check in mentally and rate hunger. Stop eating when satisfied not full regardless of how much food is left on the plate.  Get  more if still hungry 20-30 minutes later.  The key is to honor satisfaction so throughout the meal, rate fullness factor and stop when comfortably satisfied not physically full. The key is to honor hunger and fullness without any feelings of guilt or shame.  Pay attention to what the internal cues are, rather than any external factors. This will enhance the confidence you have in listening to your own body and following those internal cues enabling you to increase how often you eat when you are hungry not  out of appetite and stop when you are satisfied not full.  ?Encouraged pt to continue to eat balanced meals inclusive of non starchy vegetables 2 times a day 7 days a week ?Encouraged pt to choose lean protein sources: limiting beef, pork, sausage, hotdogs, and lunch meat ?Encourage pt to choose healthy fats such as plant based limiting animal fats ?Encouraged pt to continue to drink a minium 64 fluid ounces with half being plain water to satisfy proper hydration  ? ? ?Goals: ?Take a capsul multivitamin later in the day with food on your stomach  ?Eat until satisfaction not physically feeling fullness  ? ?Handouts Provided Include  ?All phase handouts  ?Bariatric MyPlate ? ?Learning Style & Readiness for Change ?Teaching method utilized: Visual & Auditory  ?Demonstrated degree of understanding via: Teach Back  ?Readiness Level: action ?Barriers to learning/adherence to lifestyle change: none identified  ? ?RD's Notes for Next Visit ?Assess adherence to pt chosen goals  ? ? ?MONITORING & EVALUATION ?Dietary intake, weekly physical activity, body weight ? ?Next Steps ?Patient is to follow-up as needed: pt states she does not feel she needs any further assistance and understands how and what to eat: dietitian advised pt to please call or email with any future questions or concerns ? ?

## 2021-09-27 DIAGNOSIS — S52551A Other extraarticular fracture of lower end of right radius, initial encounter for closed fracture: Secondary | ICD-10-CM | POA: Diagnosis not present

## 2021-10-07 DIAGNOSIS — R52 Pain, unspecified: Secondary | ICD-10-CM | POA: Diagnosis not present

## 2021-10-07 DIAGNOSIS — S52551D Other extraarticular fracture of lower end of right radius, subsequent encounter for closed fracture with routine healing: Secondary | ICD-10-CM | POA: Diagnosis not present

## 2021-10-14 DIAGNOSIS — S52551D Other extraarticular fracture of lower end of right radius, subsequent encounter for closed fracture with routine healing: Secondary | ICD-10-CM | POA: Diagnosis not present

## 2021-10-23 DIAGNOSIS — S52551D Other extraarticular fracture of lower end of right radius, subsequent encounter for closed fracture with routine healing: Secondary | ICD-10-CM | POA: Diagnosis not present

## 2021-10-24 DIAGNOSIS — Z6833 Body mass index (BMI) 33.0-33.9, adult: Secondary | ICD-10-CM | POA: Diagnosis not present

## 2021-10-24 DIAGNOSIS — Z8679 Personal history of other diseases of the circulatory system: Secondary | ICD-10-CM | POA: Diagnosis not present

## 2021-10-24 DIAGNOSIS — I890 Lymphedema, not elsewhere classified: Secondary | ICD-10-CM | POA: Diagnosis not present

## 2021-11-08 DIAGNOSIS — S52551D Other extraarticular fracture of lower end of right radius, subsequent encounter for closed fracture with routine healing: Secondary | ICD-10-CM | POA: Diagnosis not present

## 2021-11-19 DIAGNOSIS — R52 Pain, unspecified: Secondary | ICD-10-CM | POA: Diagnosis not present

## 2021-11-19 DIAGNOSIS — S52551D Other extraarticular fracture of lower end of right radius, subsequent encounter for closed fracture with routine healing: Secondary | ICD-10-CM | POA: Diagnosis not present

## 2021-11-19 DIAGNOSIS — M25631 Stiffness of right wrist, not elsewhere classified: Secondary | ICD-10-CM | POA: Diagnosis not present

## 2021-12-03 DIAGNOSIS — S52551D Other extraarticular fracture of lower end of right radius, subsequent encounter for closed fracture with routine healing: Secondary | ICD-10-CM | POA: Diagnosis not present

## 2021-12-03 DIAGNOSIS — R52 Pain, unspecified: Secondary | ICD-10-CM | POA: Diagnosis not present

## 2021-12-03 DIAGNOSIS — M25631 Stiffness of right wrist, not elsewhere classified: Secondary | ICD-10-CM | POA: Diagnosis not present

## 2021-12-04 DIAGNOSIS — S52551D Other extraarticular fracture of lower end of right radius, subsequent encounter for closed fracture with routine healing: Secondary | ICD-10-CM | POA: Diagnosis not present

## 2021-12-10 DIAGNOSIS — Z131 Encounter for screening for diabetes mellitus: Secondary | ICD-10-CM | POA: Diagnosis not present

## 2021-12-10 DIAGNOSIS — Z130 Encounter for screening for diseases of the blood and blood-forming organs and certain disorders involving the immune mechanism: Secondary | ICD-10-CM | POA: Diagnosis not present

## 2021-12-10 DIAGNOSIS — I10 Essential (primary) hypertension: Secondary | ICD-10-CM | POA: Diagnosis not present

## 2021-12-10 DIAGNOSIS — Z1321 Encounter for screening for nutritional disorder: Secondary | ICD-10-CM | POA: Diagnosis not present

## 2021-12-10 DIAGNOSIS — Z0000 Encounter for general adult medical examination without abnormal findings: Secondary | ICD-10-CM | POA: Diagnosis not present

## 2021-12-10 DIAGNOSIS — Z1329 Encounter for screening for other suspected endocrine disorder: Secondary | ICD-10-CM | POA: Diagnosis not present

## 2021-12-10 DIAGNOSIS — Z13220 Encounter for screening for lipoid disorders: Secondary | ICD-10-CM | POA: Diagnosis not present

## 2021-12-10 DIAGNOSIS — Z6832 Body mass index (BMI) 32.0-32.9, adult: Secondary | ICD-10-CM | POA: Diagnosis not present

## 2021-12-10 DIAGNOSIS — Z13228 Encounter for screening for other metabolic disorders: Secondary | ICD-10-CM | POA: Diagnosis not present

## 2021-12-10 DIAGNOSIS — E6609 Other obesity due to excess calories: Secondary | ICD-10-CM | POA: Diagnosis not present

## 2021-12-26 DIAGNOSIS — Z903 Acquired absence of stomach [part of]: Secondary | ICD-10-CM | POA: Diagnosis not present

## 2022-01-24 DIAGNOSIS — M654 Radial styloid tenosynovitis [de Quervain]: Secondary | ICD-10-CM | POA: Diagnosis not present

## 2022-03-05 DIAGNOSIS — L659 Nonscarring hair loss, unspecified: Secondary | ICD-10-CM | POA: Diagnosis not present

## 2022-03-05 DIAGNOSIS — Z1231 Encounter for screening mammogram for malignant neoplasm of breast: Secondary | ICD-10-CM | POA: Diagnosis not present

## 2022-03-05 DIAGNOSIS — Z01419 Encounter for gynecological examination (general) (routine) without abnormal findings: Secondary | ICD-10-CM | POA: Diagnosis not present

## 2022-03-05 DIAGNOSIS — Z6833 Body mass index (BMI) 33.0-33.9, adult: Secondary | ICD-10-CM | POA: Diagnosis not present

## 2022-03-06 DIAGNOSIS — Z124 Encounter for screening for malignant neoplasm of cervix: Secondary | ICD-10-CM | POA: Diagnosis not present

## 2022-05-21 DIAGNOSIS — M25561 Pain in right knee: Secondary | ICD-10-CM | POA: Diagnosis not present

## 2022-05-22 DIAGNOSIS — M25562 Pain in left knee: Secondary | ICD-10-CM | POA: Diagnosis not present

## 2022-08-28 IMAGING — RF DG UGI W SINGLE CM
16 of 18 series · 17 of 24 positions shown · non-contrast
Comparison: None.

CLINICAL DATA: Morbid obesity (HCC), preop for bariatric surgery

EXAM:
UPPER GI SERIES WITH KUB
TECHNIQUE: After obtaining a scout radiograph a routine upper GI series was
performed using thin barium
FLUOROSCOPY TIME:  Fluoroscopy Time:  2 minutes 6 seconds
Radiation Exposure Index (if provided by the fluoroscopic device):
82.1 mGy
Number of Acquired Spot Images: 10 images.  Multiple cine clips.

[Series 1: t abdomen supine · 0.15mm/px · 1 of 1 slices shown]
[im 1/1]
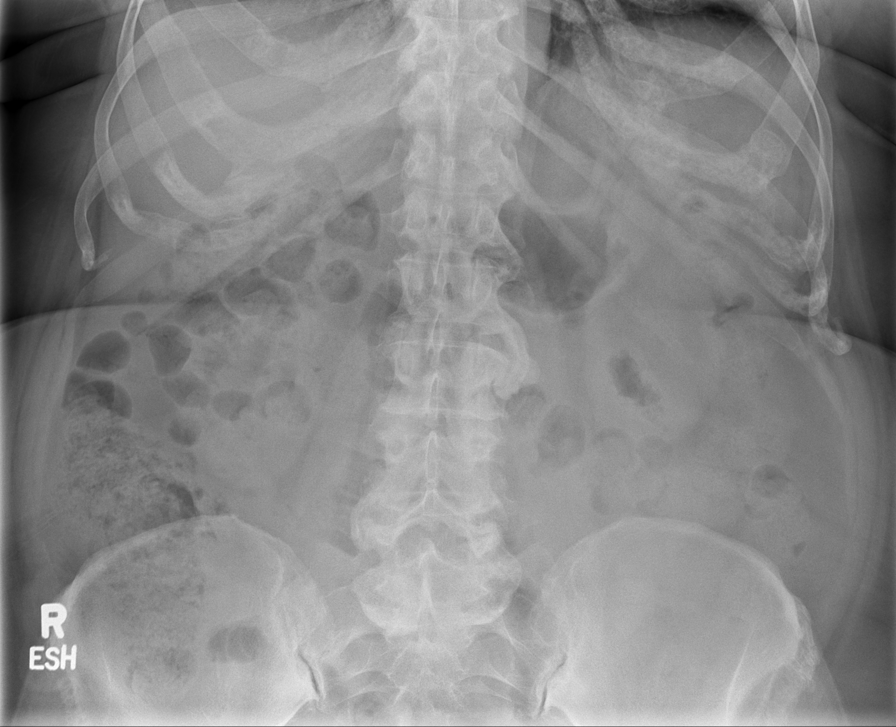

[Series 4: cp_standard · 0.55mm/px · 2 of 108 frames shown (1 of 15)]
[frame 10/108]
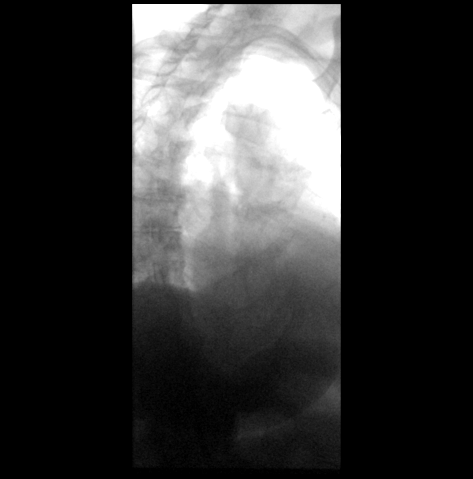
[frame 92/108]
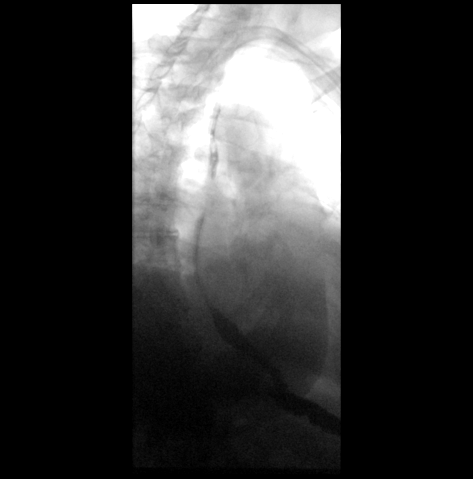

[Series 6: cp_standard · 0.55mm/px · 1 of 79 frames shown (2 of 15)]
[frame 28/79]
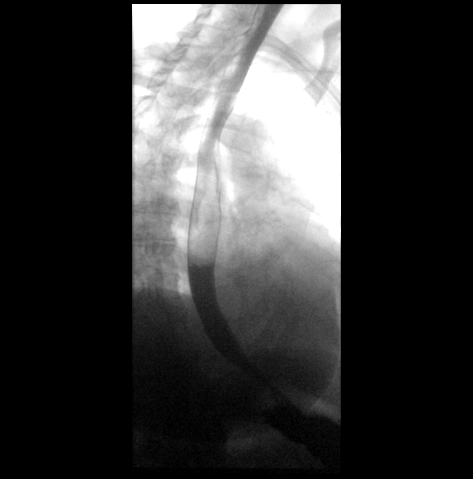

[Series 7: cp_standard · 0.55mm/px · 1 of 14 frames shown (3 of 15)]
[frame 12/14]
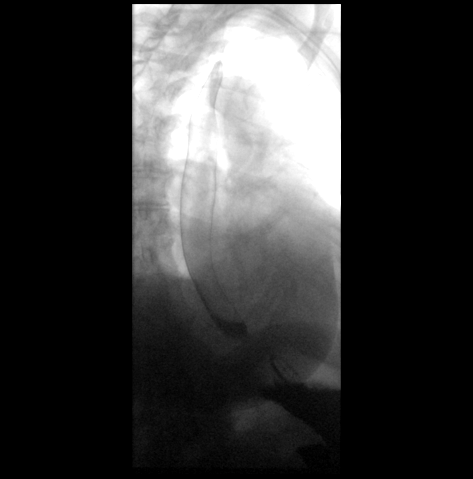

[Series 9: cp_standard · 0.58mm/px · 1 of 37 frames shown (4 of 15)]
[frame 6/37]
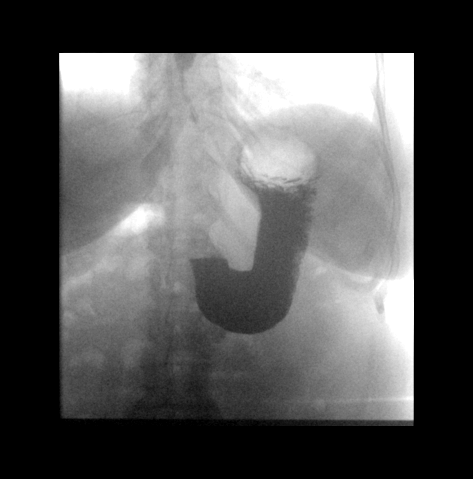

[Series 10: cp_standard · 0.55mm/px · 1 of 20 frames shown (5 of 15)]
[frame 18/20]
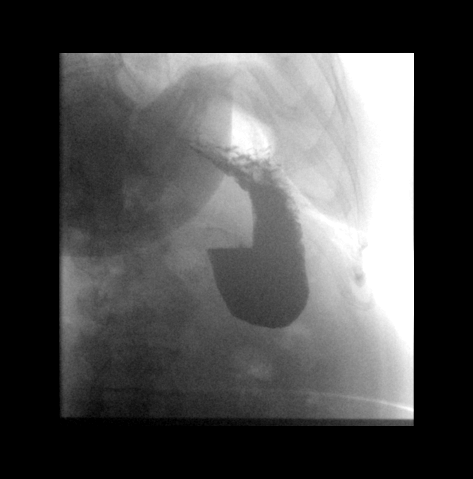

[Series 11: cp_standard · 0.55mm/px · 1 of 16 frames shown (6 of 15)]
[frame 9/16]
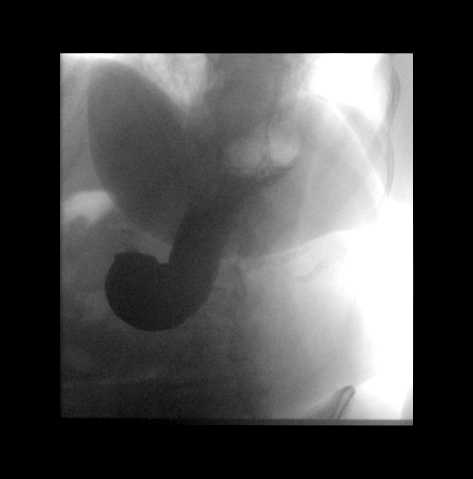

[Series 12: cp_standard · 0.52mm/px · 1 of 35 frames shown (7 of 15)]
[frame 34/35]
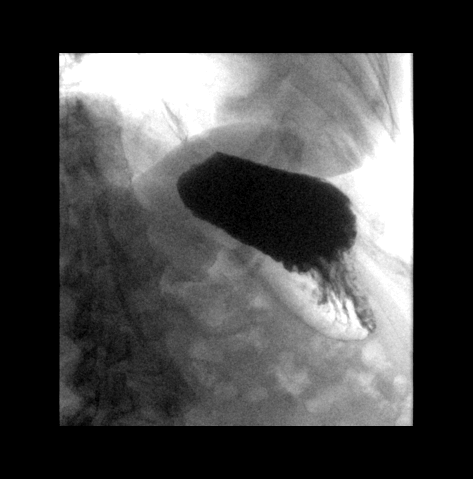

[Series 15: cp_standard · 0.27mm/px · 1 of 1 slices shown (8 of 15)]
[im 1/1]
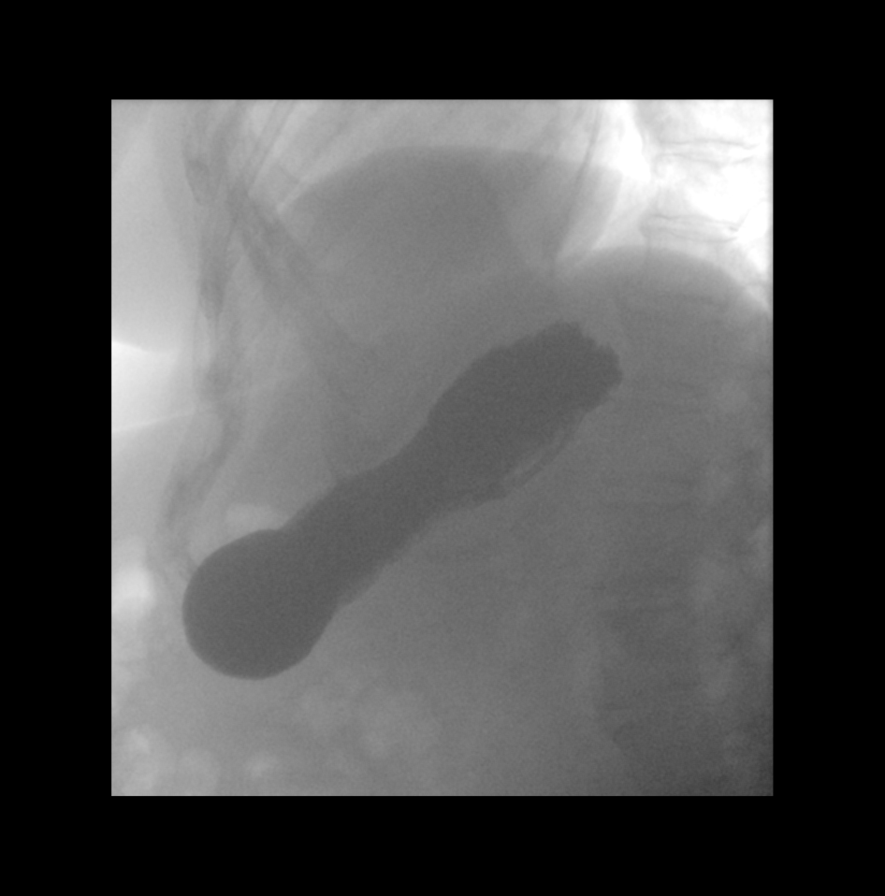

[Series 16: cp_standard · 0.53mm/px · 1 of 131 frames shown (9 of 15)]
[frame 20/131]
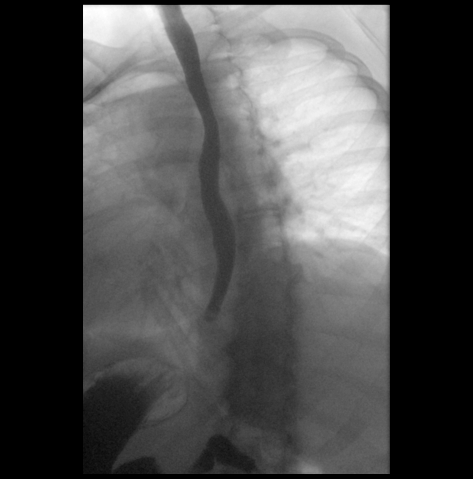

[Series 18: cp_standard · 0.53mm/px · 1 of 91 frames shown (10 of 15)]
[frame 61/91]
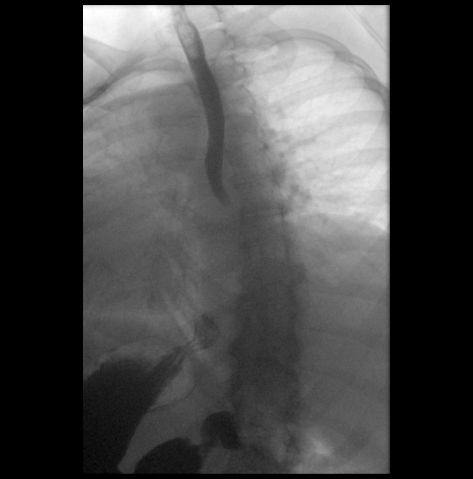

[Series 19: cp_standard · 0.53mm/px · 1 of 29 frames shown (11 of 15)]
[frame 15/29]
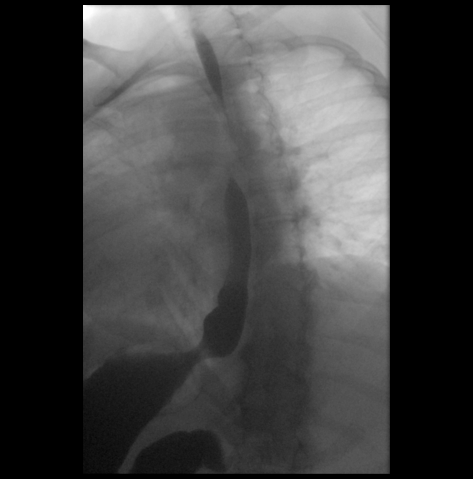

[Series 20: cp_standard · 0.53mm/px · 1 of 111 frames shown (12 of 15)]
[frame 95/111]
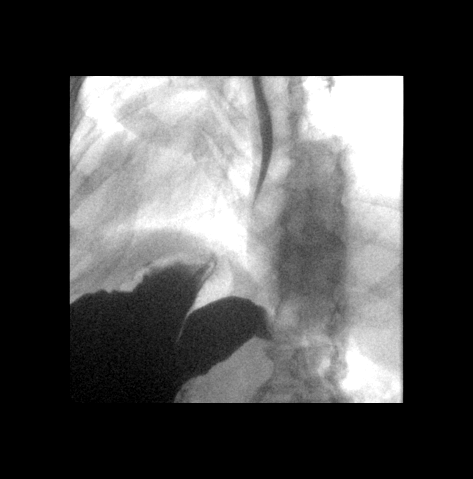

[Series 21: cp_standard · 0.53mm/px · 1 of 119 frames shown (13 of 15)]
[frame 18/119]
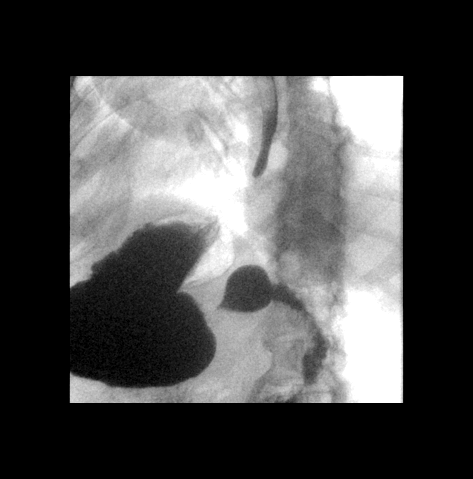

[Series 22: cp_standard · 0.51mm/px · 1 of 48 frames shown (14 of 15)]
[frame 8/48]
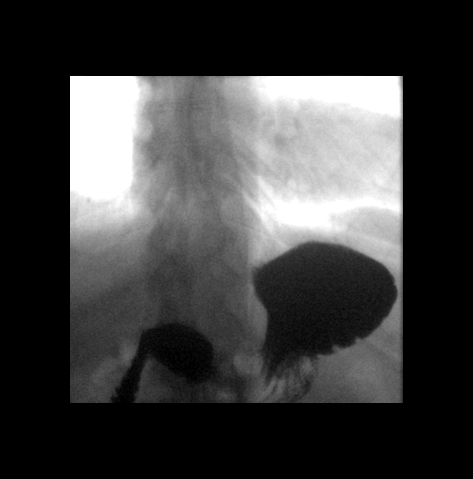

[Series 24: cp_standard · 0.26mm/px · 1 of 1 slices shown (15 of 15)]
[im 1/1]
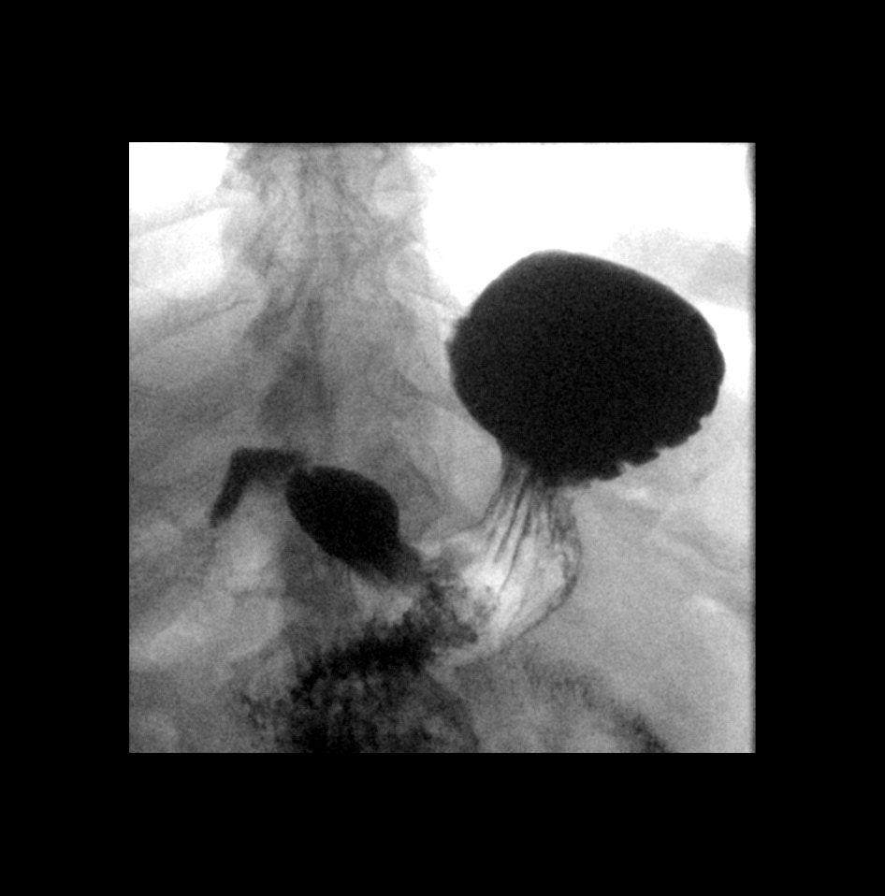

[17 of 24 positions shown; findings below may reference images not displayed]

FINDINGS: Thoracic esophagram a demonstrates mild esophageal dysmotility.
There is no evidence of esophageal stricture or visible ulcer.

Gastric morphology is normal. The duodenum is normal. Normal
positioning of the duodenal jejunal junction.

There is no evidence of hiatal hernia. No gastroesophageal reflux
occurred during the exam.

A 13 mm barium tablet passed without difficulty into the stomach.
IMPRESSION: Mild esophageal dysmotility.  Otherwise, normal upper GI exam.

## 2022-09-17 ENCOUNTER — Encounter: Payer: Self-pay | Admitting: Gastroenterology

## 2022-12-11 ENCOUNTER — Encounter: Payer: Self-pay | Admitting: Gastroenterology

## 2023-01-29 ENCOUNTER — Encounter (HOSPITAL_COMMUNITY): Payer: Self-pay | Admitting: *Deleted

## 2023-02-27 ENCOUNTER — Ambulatory Visit (AMBULATORY_SURGERY_CENTER): Payer: BC Managed Care – PPO

## 2023-02-27 VITALS — Ht 67.0 in | Wt 198.0 lb

## 2023-02-27 DIAGNOSIS — Z860100 Personal history of colon polyps, unspecified: Secondary | ICD-10-CM

## 2023-02-27 NOTE — Progress Notes (Signed)

## 2023-03-13 ENCOUNTER — Encounter: Payer: Self-pay | Admitting: Gastroenterology

## 2023-03-17 ENCOUNTER — Telehealth: Payer: Self-pay | Admitting: Gastroenterology

## 2023-03-17 NOTE — Telephone Encounter (Signed)
Please call this patient back and let her know that she will need to come pick up these instructions as the mail is not guaranteed to reach her in time for her to complete ALL steps for the prep;  please offer our apologies for this inconvenience for her- unless she can attempt to get into her MyChart-please let me know what she decides as I will assist in printing these instructions and placing them on 2nd floor with receptionist for her to get

## 2023-03-17 NOTE — Telephone Encounter (Signed)
Inbound call from patient stating that she has not received her instructions in the mail for her colonoscopy that is scheduled for 10/30. Patient is requesting instructions to be resent in the mail due to not being able to get into her Mychart. Please advise.

## 2023-03-17 NOTE — Telephone Encounter (Signed)
LVM for patient to call back. ?

## 2023-03-25 ENCOUNTER — Encounter: Payer: Self-pay | Admitting: Gastroenterology

## 2023-03-25 ENCOUNTER — Ambulatory Visit: Payer: BC Managed Care – PPO | Admitting: Gastroenterology

## 2023-03-25 VITALS — BP 162/88 | HR 58 | Temp 97.3°F | Resp 17 | Ht 67.0 in | Wt 198.0 lb

## 2023-03-25 DIAGNOSIS — Z09 Encounter for follow-up examination after completed treatment for conditions other than malignant neoplasm: Secondary | ICD-10-CM

## 2023-03-25 DIAGNOSIS — Z860100 Personal history of colon polyps, unspecified: Secondary | ICD-10-CM

## 2023-03-25 DIAGNOSIS — D123 Benign neoplasm of transverse colon: Secondary | ICD-10-CM

## 2023-03-25 MED ORDER — SODIUM CHLORIDE 0.9 % IV SOLN: 500.0000 mL | INTRAVENOUS | Status: DC

## 2023-03-25 NOTE — Progress Notes (Signed)
Pt's states no medical or surgical changes since previsit or office visit. 

## 2023-03-25 NOTE — Progress Notes (Signed)
Vss nad trans to pacu 

## 2023-03-25 NOTE — Op Note (Signed)
[facility] Patient Name: Rhonda Roberson Procedure Date: 03/25/2023 8:29 AM MRN: [id number] Endoscopist: Lynann Bologna , MD, [id number] Age: 62 Referring MD:  Date of Birth: 07/18/1960 Gender: Female Account #: [id number] Procedure:                Colonoscopy Indications:              High risk colon cancer surveillance: Personal                            history of colonic polyps. FH CRC (mom <60) Medicines:                Monitored Anesthesia Care Procedure:                Pre-Anesthesia Assessment:                           - Prior to the procedure, a History and Physical                            was performed, and patient medications and                            allergies were reviewed. The patient's tolerance of                            previous anesthesia was also reviewed. The risks                            and benefits of the procedure and the sedation                            options and risks were discussed with the patient.                            All questions were answered, and informed consent                            was obtained. Prior Anticoagulants: The patient has                            taken no anticoagulant or antiplatelet agents. ASA                            Grade Assessment: II - A patient with mild systemic                            disease. After reviewing the risks and benefits,                            the patient was deemed in satisfactory condition to                            undergo the procedure.  After obtaining informed consent, the colonoscope                            was passed under direct vision. Throughout the                            procedure, the patient's blood pressure, pulse, and                            oxygen saturations were monitored continuously. The                            Olympus Scope SN [id number] was introduced through the                            anus and advanced  to the 2 cm into the ileum. The                            colonoscopy was performed without difficulty. The                            patient tolerated the procedure well. The quality                            of the bowel preparation was good. The terminal                            ileum, ileocecal valve, appendiceal orifice, and                            rectum were photographed. Scope In: 8:40:00 AM Scope Out: 8:50:08 AM Scope Withdrawal Time: 0 hours 6 minutes 32 seconds  Total Procedure Duration: 0 hours 10 minutes 8 seconds  Findings:                 A 4 mm polyp was found in the mid transverse colon.                            The polyp was sessile. The polyp was removed with a                            cold snare. Resection and retrieval were complete.                           A few rare small-mouthed diverticula were found in                            the sigmoid colon.                           Non-bleeding internal hemorrhoids were found during                            retroflexion. The hemorrhoids were  small and Grade                            I (internal hemorrhoids that do not prolapse).                           The terminal ileum appeared normal.                           The exam was otherwise without abnormality on                            direct and retroflexion views. Complications:            No immediate complications. Estimated Blood Loss:     Estimated blood loss: none. Impression:               - One 4 mm polyp in the mid transverse colon,                            removed with a cold snare. Resected and retrieved.                           - Very Minimal sigmoid diverticulosis.                           - Non-bleeding internal hemorrhoids.                           - The examined portion of the ileum was normal.                           - The examination was otherwise normal on direct                            and retroflexion  views. Recommendation:           - Patient has a contact number available for                            emergencies. The signs and symptoms of potential                            delayed complications were discussed with the                            patient. Return to normal activities tomorrow.                            Written discharge instructions were provided to the                            patient.                           - Resume previous diet.                           -  Continue present medications.                           - Await pathology results.                           - Repeat colonoscopy in 5 years for screening                            purposes.                           - The findings and recommendations were discussed                            with the patient's family. Lynann Bologna, MD 03/25/2023 8:56:25 AM This report has been signed electronically.

## 2023-03-25 NOTE — Patient Instructions (Signed)
Please read handouts provided. Continue present medications. Await pathology results. Repeat colonoscopy in 5 years for screening.   YOU HAD AN ENDOSCOPIC PROCEDURE TODAY AT THE [facility]:   Refer to the procedure report that was given to you for any specific questions about what was found during the examination.  If the procedure report does not answer your questions, please call your gastroenterologist to clarify.  If you requested that your care partner not be given the details of your procedure findings, then the procedure report has been included in a sealed envelope for you to review at your convenience later.  YOU SHOULD EXPECT: Some feelings of bloating in the abdomen. Passage of more gas than usual.  Walking can help get rid of the air that was put into your GI tract during the procedure and reduce the bloating. If you had a lower endoscopy (such as a colonoscopy or flexible sigmoidoscopy) you may notice spotting of blood in your stool or on the toilet paper. If you underwent a bowel prep for your procedure, you may not have a normal bowel movement for a few days.  Please Note:  You might notice some irritation and congestion in your nose or some drainage.  This is from the oxygen used during your procedure.  There is no need for concern and it should clear up in a day or so.  SYMPTOMS TO REPORT IMMEDIATELY:  Following lower endoscopy (colonoscopy or flexible sigmoidoscopy):  Excessive amounts of blood in the stool  Significant tenderness or worsening of abdominal pains  Swelling of the abdomen that is new, acute  Fever of 100F or higher  For urgent or emergent issues, a gastroenterologist can be reached at any hour by calling [phone]. Do not use MyChart messaging for urgent concerns.    DIET:  We do recommend a small meal at first, but then you may proceed to your regular diet.  Drink plenty of fluids but you should avoid alcoholic beverages for 24  hours.  ACTIVITY:  You should plan to take it easy for the rest of today and you should NOT DRIVE or use heavy machinery until tomorrow (because of the sedation medicines used during the test).    FOLLOW UP: Our staff will call the number listed on your records the next business day following your procedure.  We will call around 7:15- 8:00 am to check on you and address any questions or concerns that you may have regarding the information given to you following your procedure. If we do not reach you, we will leave a message.     If any biopsies were taken you will be contacted by phone or by letter within the next 1-3 weeks.  Please call us at [phone] if you have not heard about the biopsies in 3 weeks.    SIGNATURES/CONFIDENTIALITY: You and/or your care partner have signed paperwork which will be entered into your electronic medical record.  These signatures attest to the fact that that the information above on your After Visit Summary has been reviewed and is understood.  Full responsibility of the confidentiality of this discharge information lies with you and/or your care-partner. 

## 2023-03-25 NOTE — Progress Notes (Signed)
Called to room to assist during endoscopic procedure.  Patient ID and intended procedure confirmed with present staff. Received instructions for my participation in the procedure from the performing physician.  

## 2023-03-26 ENCOUNTER — Telehealth: Payer: Self-pay | Admitting: *Deleted

## 2023-03-26 NOTE — Telephone Encounter (Signed)
  Follow up Call-     03/25/2023    7:43 AM  Call back number  Post procedure Call Back phone  # [phone]  Permission to leave phone message Yes   [facility]

## 2023-03-27 LAB — SURGICAL PATHOLOGY

## 2023-04-06 ENCOUNTER — Encounter: Payer: Self-pay | Admitting: Gastroenterology

## 2023-07-30 DIAGNOSIS — H43823 Vitreomacular adhesion, bilateral: Secondary | ICD-10-CM | POA: Diagnosis not present

## 2023-07-30 DIAGNOSIS — Q141 Congenital malformation of retina: Secondary | ICD-10-CM | POA: Diagnosis not present

## 2023-12-23 DIAGNOSIS — Z13820 Encounter for screening for osteoporosis: Secondary | ICD-10-CM | POA: Diagnosis not present

## 2024-01-26 ENCOUNTER — Encounter (HOSPITAL_COMMUNITY): Payer: Self-pay | Admitting: *Deleted

## 2024-02-11 DIAGNOSIS — R5381 Other malaise: Secondary | ICD-10-CM | POA: Diagnosis not present

## 2024-02-11 DIAGNOSIS — R5383 Other fatigue: Secondary | ICD-10-CM | POA: Diagnosis not present

## 2024-02-11 DIAGNOSIS — N3941 Urge incontinence: Secondary | ICD-10-CM | POA: Diagnosis not present

## 2024-02-11 DIAGNOSIS — I10 Essential (primary) hypertension: Secondary | ICD-10-CM | POA: Diagnosis not present

## 2024-02-11 DIAGNOSIS — E782 Mixed hyperlipidemia: Secondary | ICD-10-CM | POA: Diagnosis not present

## 2024-03-22 DIAGNOSIS — Z1231 Encounter for screening mammogram for malignant neoplasm of breast: Secondary | ICD-10-CM | POA: Diagnosis not present

## 2024-03-22 DIAGNOSIS — Z1272 Encounter for screening for malignant neoplasm of vagina: Secondary | ICD-10-CM | POA: Diagnosis not present

## 2024-03-22 DIAGNOSIS — Z6835 Body mass index (BMI) 35.0-35.9, adult: Secondary | ICD-10-CM | POA: Diagnosis not present

## 2024-03-22 DIAGNOSIS — Z01419 Encounter for gynecological examination (general) (routine) without abnormal findings: Secondary | ICD-10-CM | POA: Diagnosis not present
